# Patient Record
Sex: Male | Born: 1995 | Race: White | Hispanic: No | Marital: Single | Smoking: Current every day smoker
Health system: Southern US, Community
[De-identification: ages and names within clinical notes are randomized; demographics above are authoritative.]

## PROBLEM LIST (undated history)

## (undated) DIAGNOSIS — F419 Anxiety disorder, unspecified: Secondary | ICD-10-CM

## (undated) HISTORY — PX: BACK SURGERY: SHX140

---

## 2017-07-27 ENCOUNTER — Inpatient Hospital Stay (HOSPITAL_COMMUNITY)
Admission: RE | Admit: 2017-07-27 | Discharge: 2017-07-30 | DRG: 885 | Disposition: A | Discharge status: Home / Self Care | Payer: PRIVATE HEALTH INSURANCE | Attending: Psychiatry | Admitting: Psychiatry

## 2017-07-27 ENCOUNTER — Encounter (HOSPITAL_COMMUNITY): Payer: Self-pay | Admitting: *Deleted

## 2017-07-27 DIAGNOSIS — F3181 Bipolar II disorder: Secondary | ICD-10-CM | POA: Diagnosis present

## 2017-07-27 DIAGNOSIS — F1210 Cannabis abuse, uncomplicated: Secondary | ICD-10-CM | POA: Diagnosis not present

## 2017-07-27 DIAGNOSIS — F17210 Nicotine dependence, cigarettes, uncomplicated: Secondary | ICD-10-CM | POA: Diagnosis present

## 2017-07-27 DIAGNOSIS — F1910 Other psychoactive substance abuse, uncomplicated: Secondary | ICD-10-CM | POA: Diagnosis not present

## 2017-07-27 DIAGNOSIS — F419 Anxiety disorder, unspecified: Secondary | ICD-10-CM | POA: Diagnosis present

## 2017-07-27 DIAGNOSIS — F319 Bipolar disorder, unspecified: Secondary | ICD-10-CM | POA: Diagnosis not present

## 2017-07-27 DIAGNOSIS — Z23 Encounter for immunization: Secondary | ICD-10-CM | POA: Diagnosis not present

## 2017-07-27 DIAGNOSIS — R45851 Suicidal ideations: Secondary | ICD-10-CM | POA: Diagnosis not present

## 2017-07-27 DIAGNOSIS — G4700 Insomnia, unspecified: Secondary | ICD-10-CM | POA: Diagnosis present

## 2017-07-27 DIAGNOSIS — M545 Low back pain: Secondary | ICD-10-CM | POA: Diagnosis present

## 2017-07-27 DIAGNOSIS — Z818 Family history of other mental and behavioral disorders: Secondary | ICD-10-CM

## 2017-07-27 DIAGNOSIS — R443 Hallucinations, unspecified: Secondary | ICD-10-CM | POA: Diagnosis not present

## 2017-07-27 DIAGNOSIS — Z79899 Other long term (current) drug therapy: Secondary | ICD-10-CM | POA: Diagnosis not present

## 2017-07-27 DIAGNOSIS — F909 Attention-deficit hyperactivity disorder, unspecified type: Secondary | ICD-10-CM | POA: Diagnosis present

## 2017-07-27 DIAGNOSIS — F1410 Cocaine abuse, uncomplicated: Secondary | ICD-10-CM | POA: Diagnosis not present

## 2017-07-27 HISTORY — DX: Anxiety disorder, unspecified: F41.9

## 2017-07-27 MED ORDER — MAGNESIUM HYDROXIDE 400 MG/5ML PO SUSP
30.0000 mL | Freq: Every day | ORAL | Status: DC | PRN
  Administered 2017-07-28 – 2017-07-29 (×2): 30 mL via ORAL
  Filled 2017-07-27 (×2): qty 30

## 2017-07-27 MED ORDER — TRAZODONE HCL 50 MG PO TABS
50.0000 mg | ORAL_TABLET | Freq: Every evening | ORAL | Status: DC | PRN
  Administered 2017-07-27 – 2017-07-28 (×2): 50 mg via ORAL
  Filled 2017-07-27 (×2): qty 1

## 2017-07-27 MED ORDER — ALUM & MAG HYDROXIDE-SIMETH 200-200-20 MG/5ML PO SUSP: 30.0000 mL | ORAL | Status: DC | PRN

## 2017-07-27 MED ORDER — HYDROXYZINE HCL 25 MG PO TABS
25.0000 mg | ORAL_TABLET | Freq: Two times a day (BID) | ORAL | Status: DC | PRN
  Administered 2017-07-27: 25 mg via ORAL
  Filled 2017-07-27: qty 1

## 2017-07-27 MED ORDER — INFLUENZA VAC SPLIT QUAD 0.5 ML IM SUSY
0.5000 mL | PREFILLED_SYRINGE | INTRAMUSCULAR | Status: AC
  Administered 2017-07-28: 0.5 mL via INTRAMUSCULAR
  Filled 2017-07-27: qty 0.5

## 2017-07-27 MED ORDER — ACETAMINOPHEN 325 MG PO TABS
650.0000 mg | ORAL_TABLET | Freq: Four times a day (QID) | ORAL | Status: DC | PRN
Start: 2017-07-27 — End: 2017-07-30
  Administered 2017-07-28: 650 mg via ORAL
  Filled 2017-07-27: qty 2

## 2017-07-27 NOTE — Progress Notes (Signed)
Pt admitted voluntary after going to see his counselor at [facility]. Pt reported si with multiple plans. He has a hx of ADHD and bipolar disorder. Pt reports hearing voices in his head arguing. He is from [place] and is a Holiday representativejunior at [employer]. Pt lives in a townhouse with several roommates. He uses THC daily and occasional cocaine. Pt had back surgery one year ago. He denies hi.

## 2017-07-27 NOTE — Progress Notes (Signed)
Patient ID: Jeffery GottronRiley Greene, male   DOB: [birth year], 21 y.o.   MRN: [id number]  D: Patient observed watching TV and interacting well with peers on approach. Pt reports school as a major stressor. Pt reports there is so much pressure on him to do well. Pt reports hearing voices of two people arguing and the arguing gets louder to a point he is not able to concentrate. Pt attended evening wrap up group and Interacted appropriately with peers. Pt endorses passive suicide ideation. Pt verbally contracted for safety.  Denies pain or discomfort.No behavioral issues noted.  A: Support and encouragement offered as needed to express needs. Medications administered as prescribed.  R: Patient is safe and cooperative on unit. Will continue to monitor  for safety and stability.

## 2017-07-27 NOTE — H&P (Signed)
Behavioral Health Medical Screening Exam  Jeffery GottronRiley Greene is an 21 y.o. male.  Total Time spent with patient: 30 minutes  Psychiatric Specialty Exam: Physical Exam  ROS  Blood pressure 118/68, pulse 87, temperature 98.6 F (37 C), temperature source Oral, resp. rate 16, height 5\' 10"  (1.778 m), weight 84.4 kg (186 lb), SpO2 100 %.Body mass index is 26.69 kg/m.  General Appearance: Well Groomed  Eye Contact:  Good  Speech:  Clear and Coherent and Normal Rate  Volume:  Normal  Mood:  Anxious and Depressed  Affect:  Congruent  Thought Process:  Coherent and Goal Directed  Orientation:  Full (Time, Place, and Person)  Thought Content:  WDL and Logical  Suicidal Thoughts:  Yes.  with intent/plan  Homicidal Thoughts:  No  Memory:  Immediate;   Good Recent;   Good Remote;   Good  Judgement:  Good  Insight:  Good  Psychomotor Activity:  Normal  Concentration: Concentration: Good and Attention Span: Good  Recall:  Good  Fund of Knowledge:Good  Language: Good  Akathisia:  Negative  Handed:  Right  AIMS (if indicated):     Assets:  Communication Skills Desire for Improvement Financial Resources/Insurance Housing Intimacy Leisure Time Physical Health Social Support Vocational/Educational  Sleep:       Musculoskeletal: Strength & Muscle Tone: within normal limits Gait & Station: normal Patient leans: N/A  Blood pressure 118/68, pulse 87, temperature 98.6 F (37 C), temperature source Oral, resp. rate 16, height 5\' 10"  (1.778 m), weight 84.4 kg (186 lb), SpO2 100 %.  Recommendations:  Based on my evaluation the patient appears to have an emergency condition for which I recommend the patient be admitted inpatient for medication management and stabilization.   Jeffery PereyraJustina A Zyonna Vardaman, NP 07/27/2017, 7:08 PM

## 2017-07-27 NOTE — BH Assessment (Signed)
Assessment Note  Jeffery Greene is an 21 y.o. male presenting to [facility] after an appointment with his therapist, Rochel Brome, at the [facility]. The patient reported SI with a plan to OD or die by carbon monoxide. The patient had been considering plans for the last few weeks. Reports strong intent to act on plan. The patient reports depression since the age of 21 yr old, used hit self in the head and remembers thinking, "I want to be dead."  The patient was treated for depression and ADHD through high school. [facility] and treated by a psychiatrist at the clinic. The patient reports auditory hallucinations, hearing opposing thoughts and derogatory statements about himself. States he also feels paranoid, referenced the a [employer], saying it feels like everyone is watching him.   The patient denies HI. Reports cannabis use daily and alcohol use on the weekends.  The patient states he feels he has been outcasted by "all" of [employer] but did not elaborate. The patient attends [employer], and Is in his junior year. The patient is majoring in business and chemistry. Reports stressors involved school, he's failing chemistry. Also states he has issues with his girlfriend Leotis Shames and family of origin issues.  The patient has missed class, stays in bed, has depressed mood, lacks judgment and insight.  Leighton Ruff, NP recommends inpatient   Diagnosis: Bipolar, depressed, with psychotic features; cannabis use disorder  Past Medical History: No past medical history on file.  No past surgical history on file.  Family History: No family history on file.  Social History:  has no tobacco, alcohol, and drug history on file.  Additional Social History:  Alcohol / Drug Use Pain Medications: see MAR Prescriptions: see MAR Over the Counter: see MAR History of alcohol / drug use?: Yes Substance #1 Name of Substance 1: alcohol 1 -  Age of First Use: UTA 1 - Amount (size/oz): UTA 1 - Frequency: weekly 1 - Duration: UTA 1 - Last Use / Amount: using on the weekends with friends Substance #2 Name of Substance 2: cannabis- smoked in a pipe 2 - Age of First Use: UTA 2 - Amount (size/oz): 1 gram to 1.5 gram over a week or more 2 - Frequency: daily, through out the day 2 - Duration: years 2 - Last Use / Amount: today  CIWA: CIWA-Ar BP: 118/68 Pulse Rate: 87 COWS:    Allergies: Allergies not on file  Home Medications:  (Not in a hospital admission)  OB/GYN Status:  No LMP for male patient.  General Assessment Data Location of Assessment: [facility] Assessment Services TTS Assessment: In system Is this a Tele or Face-to-Face Assessment?: Face-to-Face Is this an Initial Assessment or a Re-assessment for this encounter?: Initial Assessment Marital status: Single Maiden name: n/a Is patient pregnant?: No Pregnancy Status: No Living Arrangements: Other (Comment) (dorm ) Can pt return to current living arrangement?: No Admission Status: Voluntary Is patient capable of signing voluntary admission?: Yes Referral Source: Self/Family/Friend Insurance type: self pay  Medical Screening Exam [facility] Walk-in ONLY) Medical Exam completed: Yes  Crisis Care Plan Living Arrangements: Other (Comment) (dorm ) [facility]  Education Status Is patient currently in school?: Yes Current Grade: Junior Highest grade of school patient has completed: sophmore in college Name of school: [employer]  Risk to self with the past 6 months Suicidal Ideation: Yes-Currently Present Has  patient been a risk to self within the past 6 months prior to admission? : Yes Suicidal Intent: Yes-Currently Present Has patient had any suicidal intent within the past 6 months prior to admission? : No Is patient at risk for suicide?: Yes Suicidal Plan?: Yes-Currently  Present Has patient had any suicidal plan within the past 6 months prior to admission? : No Specify Current Suicidal Plan: to OD or carbon monoxide Access to Means: Yes Specify Access to Suicidal Means: planning, considered options What has been your use of drugs/alcohol within the last 12 months?: alcohol and cannabis Previous Attempts/Gestures: Yes How many times?: 3 Other Self Harm Risks: hx of hitting head, pulling hair Triggers for Past Attempts: Unknown Intentional Self Injurious Behavior: Damaging Family Suicide History: No Recent stressful life event(s): Conflict (Comment), Trauma (Comment), Other (Comment) Persecutory voices/beliefs?: No Depression: Yes Depression Symptoms: Despondent, Tearfulness, Loss of interest in usual pleasures, Feeling worthless/self pity Substance abuse history and/or treatment for substance abuse?: Yes Suicide prevention information given to non-admitted patients: Not applicable  Risk to Others within the past 6 months Homicidal Ideation: No Does patient have any lifetime risk of violence toward others beyond the six months prior to admission? : No Thoughts of Harm to Others: No Current Homicidal Intent: No Current Homicidal Plan: No Access to Homicidal Means: No Identified Victim: n/a History of harm to others?: No Assessment of Violence: None Noted Does patient have access to weapons?: No Criminal Charges Pending?: No Does patient have a court date: No Is patient on probation?: No  Psychosis Hallucinations: Auditory Delusions: Unspecified  Mental Status Report Appearance/Hygiene: Unremarkable Eye Contact: Fair Motor Activity: Freedom of movement Speech: Logical/coherent Level of Consciousness: Alert Mood: Depressed, Anxious Affect: Depressed Anxiety Level: Severe Thought Processes: Coherent, Relevant Judgement: Impaired Orientation: Person, Place, Time, Situation Obsessive Compulsive Thoughts/Behaviors: None  Cognitive  Functioning Concentration: Normal Memory: Recent Intact, Remote Intact IQ: Average Insight: Poor Impulse Control: Poor Appetite: Good Weight Loss: 0 Weight Gain: 0 Sleep: Decreased Vegetative Symptoms: Staying in bed  ADLScreening [facility] Patient's cognitive ability adequate to safely complete daily activities?: Yes Patient able to express need for assistance with ADLs?: Yes Independently performs ADLs?: Yes (appropriate for developmental age)  Prior Inpatient Therapy Prior Inpatient Therapy: No  Prior Outpatient Therapy Prior Outpatient Therapy: Yes Prior Therapy Dates: ongoing Prior Therapy [facility]ilty[facility]Provider([facility] Reason for Treatment: bipolar Does patient have an ACCT team?: No Does patient have Intensive In-House Services?  : No Does patient have [facility] services? : No Does patient have P4CC services?: No  ADL Screening (condition at time of admission) Patient's cognitive ability adequate to safely complete daily activities?: Yes Is the patient deaf or have difficulty hearing?: No Does the patient have difficulty seeing, even when wearing glasses/contacts?: No Does the patient have difficulty concentrating, remembering, or making decisions?: No Patient able to express need for assistance with ADLs?: Yes Does the patient have difficulty dressing or bathing?: No Independently performs ADLs?: Yes (appropriate for developmental age)       Abuse/Neglect Assessment (Assessment to be complete while patient is alone) Physical Abuse:  (UTA) Verbal Abuse:  (UTA) Sexual Abuse:  (UTA)     Advance Directives (For Healthcare) Does Patient Have a Medical Advance Directive?: No    Additional Information 1:1 In Past 12 Months?: No CIRT Risk: No Elopement Risk: No Does patient have medical clearance?: Yes     Disposition:  Disposition Initial Assessment Completed for this Encounter: Yes Disposition of Patient: Inpatient treatment  program Type of inpatient treatment program: Adult  On Site Evaluation by:   Reviewed with Physician:  Leighton Ruff, NP recommends inpatient   Vonzell Schlatter [facility] 07/27/2017 5:37 PM

## 2017-07-27 NOTE — Tx Team (Signed)
Initial Treatment Plan 07/27/2017 7:33 PM Jeffery Greene QIO:[id number]    PATIENT STRESSORS: Marital or family conflict   PATIENT STRENGTHS: Ability for insight Average or above average intelligence Communication skills General fund of knowledge Physical Health Supportive family/friends   PATIENT IDENTIFIED PROBLEMS: Family conflict  School stress                   DISCHARGE CRITERIA:  Ability to meet basic life and health needs Adequate post-discharge living arrangements Improved stabilization in mood, thinking, and/or behavior Motivation to continue treatment in a less acute level of care Need for constant or close observation no longer present Reduction of life-threatening or endangering symptoms to within safe limits Safe-care adequate arrangements made Verbal commitment to aftercare and medication compliance  PRELIMINARY DISCHARGE PLAN: Outpatient therapy Return to previous living arrangement Return to previous work or school arrangements  PATIENT/FAMILY INVOLVEMENT: This treatment plan has been presented to and reviewed with the patient, Jeffery GottronRiley Kraker, and/or family member, .  The patient and family have been given the opportunity to ask questions and make suggestions.  Beatrix ShipperWright, Reggie Bise Martin, RN 07/27/2017, 7:33 PM

## 2017-07-27 NOTE — Progress Notes (Signed)
Attended group but [facility].

## 2017-07-28 DIAGNOSIS — G4700 Insomnia, unspecified: Secondary | ICD-10-CM

## 2017-07-28 DIAGNOSIS — F319 Bipolar disorder, unspecified: Secondary | ICD-10-CM

## 2017-07-28 DIAGNOSIS — R45851 Suicidal ideations: Secondary | ICD-10-CM

## 2017-07-28 DIAGNOSIS — F1410 Cocaine abuse, uncomplicated: Secondary | ICD-10-CM

## 2017-07-28 DIAGNOSIS — R443 Hallucinations, unspecified: Secondary | ICD-10-CM

## 2017-07-28 DIAGNOSIS — F17210 Nicotine dependence, cigarettes, uncomplicated: Secondary | ICD-10-CM

## 2017-07-28 DIAGNOSIS — F1910 Other psychoactive substance abuse, uncomplicated: Secondary | ICD-10-CM

## 2017-07-28 DIAGNOSIS — F419 Anxiety disorder, unspecified: Secondary | ICD-10-CM

## 2017-07-28 DIAGNOSIS — F1210 Cannabis abuse, uncomplicated: Secondary | ICD-10-CM

## 2017-07-28 LAB — ETHANOL: Alcohol, Ethyl (B): <10 mg/dL (ref <10)

## 2017-07-28 MED ORDER — ARIPIPRAZOLE 5 MG PO TABS: 5.0000 mg | ORAL_TABLET | Freq: Every day | ORAL | Status: DC

## 2017-07-28 MED ORDER — ARIPIPRAZOLE 5 MG PO TABS
5.0000 mg | ORAL_TABLET | Freq: Every day | ORAL | Status: DC
  Administered 2017-07-28 – 2017-07-30 (×3): 5 mg via ORAL
  Filled 2017-07-28 (×5): qty 1

## 2017-07-28 MED ORDER — BUPROPION HCL ER (XL) 150 MG PO TB24
150.0000 mg | ORAL_TABLET | Freq: Every day | ORAL | Status: DC
Start: 2017-07-28 — End: 2017-07-30
  Administered 2017-07-28 – 2017-07-30 (×3): 150 mg via ORAL
  Filled 2017-07-28 (×6): qty 1

## 2017-07-28 MED ORDER — DOCUSATE SODIUM 100 MG PO CAPS
100.0000 mg | ORAL_CAPSULE | Freq: Every day | ORAL | Status: DC
  Administered 2017-07-28 – 2017-07-30 (×3): 100 mg via ORAL
  Filled 2017-07-28 (×6): qty 1

## 2017-07-28 MED ORDER — GABAPENTIN 100 MG PO CAPS
100.0000 mg | ORAL_CAPSULE | Freq: Three times a day (TID) | ORAL | Status: DC
  Administered 2017-07-28 – 2017-07-30 (×6): 100 mg via ORAL
  Filled 2017-07-28 (×11): qty 1

## 2017-07-28 MED ORDER — HYDROXYZINE HCL 25 MG PO TABS
25.0000 mg | ORAL_TABLET | Freq: Four times a day (QID) | ORAL | Status: DC | PRN
  Administered 2017-07-28: 25 mg via ORAL
  Filled 2017-07-28: qty 1

## 2017-07-28 NOTE — Progress Notes (Signed)
Nutrition Brief Note  Patient identified on the Malnutrition Screening Tool (MST) Report  Wt Readings from Last 15 Encounters:  07/27/17 186 lb (84.4 kg)    Body mass index is 26.69 kg/m. Patient meets criteria for overweight based on current BMI.  Labs and medications reviewed.   No nutrition interventions warranted at this time. If nutrition issues arise, please consult RD.   Tilda FrancoLindsey Faylene Allerton, MS, RD, LDN [facility] Clinical Dietitian Pager: [phone] After Hours Pager: [phone]

## 2017-07-28 NOTE — Plan of Care (Signed)
Problem: Safety: Goal: Periods of time without injury will increase Outcome: Progressing Q 15 minutes safety checks maintained on and off unit without self harm gestures.   Problem: Medication: Goal: Compliance with prescribed medication regimen will improve Outcome: Progressing Pt has been medication compliant this shift. Pt in agreement with medication changes made this shift as well.

## 2017-07-28 NOTE — Tx Team (Signed)
Interdisciplinary Treatment and Diagnostic Plan Update   Time of Session: 830AM Jeffery GottronRiley Greene MRN: [id number]  Principal Diagnosis: MDD, recurrent with psychotic features Secondary Diagnoses: Active Problems:   Bipolar disorder with psychotic features (HCC)   Current Medications:  Current Facility-Administered Medications  Medication Dose Route Frequency Provider Last Rate Last Dose  . acetaminophen (TYLENOL) tablet 650 mg  650 mg Oral Q6H PRN Okonkwo, Justina A, NP   650 mg at 07/28/17 0846  . alum & mag hydroxide-simeth (MAALOX/MYLANTA) 200-200-20 MG/5ML suspension 30 mL  30 mL Oral Q4H PRN Okonkwo, Justina A, NP      . hydrOXYzine (ATARAX/VISTARIL) tablet 25 mg  25 mg Oral BID PRN Beryle Lathekonkwo, Justina A, NP   25 mg at 07/27/17 2125  . Influenza vac split quadrivalent PF (FLUARIX) injection 0.5 mL  0.5 mL Intramuscular Tomorrow-1000 Nwoko, Agnes I, NP      . magnesium hydroxide (MILK OF MAGNESIA) suspension 30 mL  30 mL Oral Daily PRN Okonkwo, Justina A, NP   30 mL at 07/28/17 0845  . traZODone (DESYREL) tablet 50 mg  50 mg Oral QHS PRN Beryle Lathekonkwo, Justina A, NP   50 mg at 07/27/17 2125   PTA Medications: No prescriptions prior to admission.    Patient Stressors: Marital or family conflict  Patient Strengths: Ability for insight Average or above average intelligence Communication skills General fund of knowledge Physical Health Supportive family/friends  Treatment Modalities: Medication Management, Group therapy, Case management,  1 to 1 session with clinician, Psychoeducation, Recreational therapy.   Physician Treatment Plan for Primary Diagnosis:MDD, recurrent with psychotic features Long Term Goal(s): Improvement in symptoms so as ready for discharge Improvement in symptoms so as ready for discharge   Short Term Goals: Ability to identify changes in lifestyle to reduce recurrence of condition will improve Ability to verbalize feelings will improve Ability to disclose and  discuss suicidal ideas Ability to identify and develop effective coping behaviors will improve Compliance with prescribed medications will improve Ability to identify triggers associated with substance abuse/mental health issues will improve  Medication Management: Evaluate patient's response, side effects, and tolerance of medication regimen.  Therapeutic Interventions: 1 to 1 sessions, Unit Group sessions and Medication administration.  Evaluation of Outcomes: Progressing Physician Treatment Plan for Secondary Diagnosis: Active Problems:   Bipolar disorder with psychotic features (HCC)  Long Term Goal(s): Improvement in symptoms so as ready for discharge Improvement in symptoms so as ready for discharge   Short Term Goals: Ability to identify changes in lifestyle to reduce recurrence of condition will improve Ability to verbalize feelings will improve Ability to disclose and discuss suicidal ideas Ability to identify and develop effective coping behaviors will improve Compliance with prescribed medications will improve Ability to identify triggers associated with substance abuse/mental health issues will improve     Medication Management: Evaluate patient's response, side effects, and tolerance of medication regimen.  Therapeutic Interventions: 1 to 1 sessions, Unit Group sessions and Medication administration.  Evaluation of Outcomes: Progressing   RN Treatment Plan for Primary Diagnosis: MDD, recurrent with psychotic features Long Term Goal(s): Knowledge of disease and therapeutic regimen to maintain health will improve  Short Term Goals: Ability to remain free from injury will improve, Ability to disclose and discuss suicidal ideas and Compliance with prescribed medications will improve  Medication Management: RN will administer medications as ordered by provider, will assess and evaluate patient's response and provide education to patient for prescribed medication. RN will  report any adverse and/or side effects to  prescribing provider.  Therapeutic Interventions: 1 on 1 counseling sessions, Psychoeducation, Medication administration, Evaluate responses to treatment, Monitor vital signs and CBGs as ordered, Perform/monitor CIWA, COWS, AIMS and Fall Risk screenings as ordered, Perform wound care treatments as ordered.  Evaluation of Outcomes: Progressing   LCSW Treatment Plan for Primary Diagnosis: MDD, recurrent with psychotic features Long Term Goal(s): Safe transition to appropriate next level of care at discharge, Engage patient in therapeutic group addressing interpersonal concerns.  Short Term Goals: Engage patient in aftercare planning with referrals and resources, Increase emotional regulation and Increase skills for wellness and recovery  Therapeutic Interventions: Assess for all discharge needs, 1 to 1 time with Social worker, Explore available resources and support systems, Assess for adequacy in community support network, Educate family and significant other(s) on suicide prevention, Complete Psychosocial Assessment, Interpersonal group therapy.  Evaluation of Outcomes: Progressing   Progress in Treatment: Attending groups: Yes. Participating in groups: Yes. Taking medication as prescribed: Yes. Toleration medication: Yes. Family/Significant other contact made: No, will contact:  pt consented to contacting parents. Patient understands diagnosis: Yes. Discussing patient identified problems/goals with staff: Yes. Medical problems stabilized or resolved: Yes. Denies suicidal/homicidal ideation: Yes. Issues/concerns per patient self-inventory: No. Other: n/a  New problem(s) identified: No, Describe:  n/a  New Short Term/Long Term Goal(s): medication management for mood stabilization and development of comprehensive mental wellness plan.  Discharge Plan or Barriers: return to [facility] for [facility], home, and  school.  Reason for Continuation of Hospitalization: Anxiety Depression Medication stabilization  Estimated Length of Stay: Saturday 07/31/17  Attendees: Patient: 07/28/2017 11:34 AM  Physician: Dr. Altamese [place], MD 07/28/2017 11:34 AM  Nursing: Vivia Budge, RN 07/28/2017 11:34 AM  RN Care Manager: Onnie Boer, CM 07/28/2017 11:34 AM  Social Worker: Trula Slade, LCSW 07/28/2017 11:34 AM  Recreational Therapist: x 07/28/2017 11:34 AM  Other: Armandina Stammer, NP, Reola Calkins, NP 07/28/2017 11:34 AM  Other:  07/28/2017 11:34 AM  Other: 07/28/2017 11:34 AM    Scribe for Treatment Team: Ledell Peoples Smart, LCSW 07/28/2017 11:34 AM

## 2017-07-28 NOTE — Progress Notes (Signed)
Recreation Therapy Notes  Date: 07/28/17 Time: 1000 Location: 500 Hall Dayroom  Group Topic: Stress Management  Goal Area(s) Addresses:  Patient will verbalize importance of using healthy stress management.  Patient will identify positive emotions associated with healthy stress management.   Intervention: Construction paper, markers  Activity :  Personalized Plates.  Patients were to create a personalized plate where they were to highlight the positive things about themselves.  Patients could highlight things such as biggest accomplishment, important dates, favorite foods, etc.  Education:  Stress Management, Discharge Planning.   Education Outcome: Acknowledges edcuation/In group clarification offered/Needs additional education  Clinical Observations/Feedback:  Pt did not attend group.   Kariyah Baugh, LRT/CTRS         Shanyn Preisler A 07/28/2017 12:32 PM 

## 2017-07-28 NOTE — H&P (Signed)
Psychiatric Admission Assessment Adult  Patient Identification: Jeffery Greene  MRN:  [id number]  Date of Evaluation:  07/28/2017  Chief Complaint:  Worsening Bipolar disorder with psychotic features, triggering suicidal ideations with plans.  Principal Diagnosis: Bipolar disorder with psychotic features.  Diagnosis:   Patient Active Problem List   Diagnosis Date Noted  . Bipolar disorder with psychotic features (HCC) [F31.9] 07/27/2017   History of Present Illness: This is an admission assessment for this 21 year old Caucasian male. He is currently a Consulting civil engineer at the [employer]. He came to the [facility] as a walk-in seeking treatment for worsening Bipolar disorder symptoms. Chart review indicated that Jeffery Greene presented with complaints of suicidal thoughts with plans to overdose on medication or suffocation using Carbon Monoxide. There was a mention of what presents as ideas of reference, referencing the [employer].  During this assessment, Jeffery Greene presents, casually dressed, alert, oriented x 4 & uptight. He reports, "I came here because I was having suicidal thoughts. This has been going for a while 6-7 years. I have never had a plan or attempted suicide. I was born this way. I was diagnosed with Bipolar disorder 2 months ago. I was put on Depakote, Wellbutrin & Vyvanse. I was also diagnosed with ADHD at age 51. I went to the mood treatment center in [facility] for help because I felt like my depression & suicidal thoughts worsened. That was when they diagnosed me with having Bipolar disorder. I think that the Depakote is helping some, however, I have other issues going on & yesterday was just a bad day that made everything else crashed. I was fighting a lot with my parents, my girlfriend & I were arguing a lot. I feel insolated from everyone. I have this back pain that is constant, it is adding to my depression. I cannot get anyone to prescribe some pain medications. Now, I cannot stay in this  hospital. I need to be discharged. I'm not like these patients, they are crazy, I'm not. So, can you discharge me?".  Associated Signs/Symptoms:  Depression Symptoms:  Jeffery Greene is currently denying any symptoms of depression. He rates his depression #0.  (Hypo) Manic Symptoms:  Labiality of Mood,  Anxiety Symptoms:  Excessive Worry,  Psychotic Symptoms:  Currently denies any hallucinations, delusional thoughts or paranoia, however, chart review reports indicated that patient was paranoid, hearing voices & having ideas of reference.  PTSD Symptoms: Denies any pTSD symptoms or event.  Total Time spent with patient: 1 hour  Past Psychiatric History: Bipolar disorder, ADHD.  Is the patient at risk to self? No.  Has the patient been a risk to self in the past 6 months? Yes.    Has the patient been a risk to self within the distant past? Yes.    Is the patient a risk to others? No.  Has the patient been a risk to others in the past 6 months? No.  Has the patient been a risk to others within the distant past? No.   Prior Inpatient Therapy: Prior Inpatient Therapy: No Prior Outpatient Therapy: Prior Outpatient Therapy: Yes Prior Therapy Dates: ongoing Prior Therapy Facilty/Provider(s): [facility] Reason for Treatment: bipolar Does patient have an ACCT team?: No Does patient have Intensive In-House Services?  : No Does patient have [facility] services? : No Does patient have P4CC services?: No  Alcohol Screening: 1. How often do you have a drink containing alcohol?: 2 to 3 times a week 2. How many drinks containing alcohol do you  have on a typical day when you are drinking?: 5 or 6 3. How often do you have six or more drinks on one occasion?: Weekly Preliminary Score: 5 4. How often during the last year have you found that you were not able to stop drinking once you had started?: Never 5. How often during the last year have you failed to do what was normally expected from you becasue of  drinking?: Never 6. How often during the last year have you needed a first drink in the morning to get yourself going after a heavy drinking session?: Never 7. How often during the last year have you had a feeling of guilt of remorse after drinking?: Never 8. How often during the last year have you been unable to remember what happened the night before because you had been drinking?: Monthly 9. Have you or someone else been injured as a result of your drinking?: No 10. Has a relative or friend or a doctor or another health worker been concerned about your drinking or suggested you cut down?: No Alcohol Use Disorder Identification Test Final Score (AUDIT): 10 Brief Intervention: Yes  Substance Abuse History in the last 12 months:  Yes.  (Smokes weed).  Consequences of Substance Abuse: Medical Consequences:  Liver damage, Possible death by overdose Legal Consequences:  Arrests, jail time, Loss of driving privilege. Family Consequences:  Family discord, divorce and or separation.  Previous Psychotropic Medications: Yes (Wellbutrin).  Psychological Evaluations: No   Past Medical History:  Past Medical History:  Diagnosis Date  . Anxiety     Past Surgical History:  Procedure Laterality Date  . BACK SURGERY     Family History: History reviewed. No pertinent family history.  Family Psychiatric  History: Major depressive disorder: Father.  Tobacco Screening: Have you used any form of tobacco in the last 30 days? (Cigarettes, Smokeless Tobacco, Cigars, and/or Pipes): Yes Tobacco use, Select all that apply: 5 or more cigarettes per day Are you interested in Tobacco Cessation Medications?: No, patient refused Counseled patient on smoking cessation including recognizing danger situations, developing coping skills and basic information about quitting provided: Refused/Declined practical counseling  Social History:  History  Alcohol use Not on file     History  Drug Use  . Types:  Cocaine, Marijuana    Comment: marijuana daily/cocaine occas    Additional Social History: Marital status: Single    Pain Medications: see MAR Prescriptions: see MAR Over the Counter: see MAR History of alcohol / drug use?: Yes Name of Substance 1: alcohol 1 - Age of First Use: UTA 1 - Amount (size/oz): UTA 1 - Frequency: weekly 1 - Duration: UTA 1 - Last Use / Amount: using on the weekends with friends Name of Substance 2: cannabis- smoked in a pipe 2 - Age of First Use: UTA 2 - Amount (size/oz): 1 gram to 1.5 gram over a week or more 2 - Frequency: daily, through out the day 2 - Duration: years 2 - Last Use / Amount: today  Allergies:   Allergies  Allergen Reactions  . Other     Tree Nuts   Lab Results: No results found for this or any previous visit (from the past 48 hour(s)).  Blood Alcohol level:  No results found for: [facility]  Metabolic Disorder Labs:  No results found for: HGBA1C, MPG No results found for: PROLACTIN No results found for: CHOL, TRIG, HDL, CHOLHDL, VLDL, LDLCALC  Current Medications: Current Facility-Administered Medications  Medication Dose Route Frequency Provider  Last Rate Last Dose  . acetaminophen (TYLENOL) tablet 650 mg  650 mg Oral Q6H PRN Okonkwo, Justina A, NP   650 mg at 07/28/17 0846  . alum & mag hydroxide-simeth (MAALOX/MYLANTA) 200-200-20 MG/5ML suspension 30 mL  30 mL Oral Q4H PRN Okonkwo, Justina A, NP      . hydrOXYzine (ATARAX/VISTARIL) tablet 25 mg  25 mg Oral BID PRN Lu Duffel, Justina A, NP   25 mg at 07/27/17 2125  . Influenza vac split quadrivalent PF (FLUARIX) injection 0.5 mL  0.5 mL Intramuscular Tomorrow-1000 Nwoko, Agnes I, NP      . magnesium hydroxide (MILK OF MAGNESIA) suspension 30 mL  30 mL Oral Daily PRN Okonkwo, Justina A, NP   30 mL at 07/28/17 0845  . traZODone (DESYREL) tablet 50 mg  50 mg Oral QHS PRN Lu Duffel, Justina A, NP   50 mg at 07/27/17 2125   PTA Medications: No prescriptions prior to admission.    Musculoskeletal: Strength & Muscle Tone: within normal limits Gait & Station: normal Patient leans: N/A  Psychiatric Specialty Exam: Physical Exam  Constitutional: He appears well-developed.  HENT:  Head: Normocephalic.  Eyes: Pupils are equal, round, and reactive to light.  Neck: Normal range of motion.  Cardiovascular: Normal rate.   Respiratory: Effort normal.  GI: Soft.  Genitourinary:  Genitourinary Comments: Deferred  Musculoskeletal: Normal range of motion.  Neurological: He is alert.  Skin: Skin is warm.    Review of Systems  Constitutional: Negative.   HENT: Negative.   Eyes: Negative.   Respiratory: Negative.   Cardiovascular: Negative.   Gastrointestinal: Negative.   Genitourinary: Negative.   Musculoskeletal: Negative.   Skin: Negative.   Neurological: Negative.   Endo/Heme/Allergies: Negative.   Psychiatric/Behavioral: Positive for depression, hallucinations (Auditory), substance abuse ( THC use) and suicidal ideas. Negative for memory loss. The patient is nervous/anxious and has insomnia.     Blood pressure (!) 100/55, pulse 96, temperature 97.7 F (36.5 C), temperature source Oral, resp. rate 20, height $RemoveBe'5\' 10"'PehUPROFq$  (1.778 m), weight 84.4 kg (186 lb), SpO2 100 %.Body mass index is 26.69 kg/m.  General Appearance: Casual, anxious, uptight about being in this hospital  Eye Contact:  Fair  Speech:  Clear and Coherent and Normal Rate  Volume:  Normal  Mood:  Anxious  Affect:  Flat  Thought Process:  Coherent and Linear  Orientation:  Full (Time, Place, and Person)  Thought Content:  Denies any hallucinations, delusional thoughts or paranoia.  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 Admit for crisis management and stabilization, estimated length of stay 3-5 days.   2. Medication management to reduce current symptoms to base line and improve the patient's overall level of functioning: See MAR, Md's SRA & treatment plan.   3. Treat health problems as indicated.  4. Develop treatment plan to decrease risk of relapse upon discharge and the need for readmission.  5. Psycho-social education regarding relapse prevention and self care.  6. Health care follow up as needed for medical problems.  7. Review, reconcile, and reinstate any pertinent home medications for other health issues where appropriate. 8. Call for consults with hospitalist for any additional specialty patient care services as needed.  Observation Level/Precautions:  15 minute checks  Laboratory:  Will obtain: CBC with diff, USD, toxicology tests, TSH, Prolactin levels, Lipid panel, U/A, HGBA1c  Psychotherapy: Group sessions   Medications: See MAR   Consultations: As needed   Discharge Concerns: Safety, mood stability.  Estimated LOS: 5-7 days  Other: Admit to the 500-hall.     Physician Treatment Plan for Primary Diagnosis: Will initiate medication management for mood stability. Set up an outpatient psychiatric services for medication management. Will encourage medication adherence with psychiatric medications.  Long Term Goal(s): Improvement in symptoms so as ready for discharge  Short Term Goals: Ability to identify changes in lifestyle to reduce recurrence of condition will improve, Ability to verbalize feelings will improve and Ability to disclose and discuss suicidal ideas  Physician Treatment Plan for Secondary Diagnosis: Active  Problems:   Bipolar disorder with psychotic features ([place])  Long Term Goal(s): Improvement in symptoms so as ready for discharge  Short Term Goals: Ability to identify and develop effective coping behaviors will improve, Compliance with prescribed medications will improve and Ability to identify triggers associated with substance abuse/mental health issues will improve  I certify that inpatient services furnished can reasonably be expected to improve the patient's condition.    Jeffery Slates, NP, PMHNP, FNP-BC 10/24/201810:56 AM   I have reviewed NP's Note, assessement, diagnosis and plan, and agree. I have also met with patient and completed suicide risk assessment.  Jeffery Greene is a 21 y/o M with history of bipolar disorder who presented voluntarily via his therapy appointment when he expressed worsening depression and SI with plan to use carbon monoxide. Pt also had expressed intrusive thoughts that he characterized as voices which told him negative things. He also experienced paranoid thoughts that he was being monitored or watched whenever he came into a room. He lists several stressors including strained relationship with his parents, strained relationship with his girlfriend, having to drop a chemistry course and having worsened grades. He describes struggling with depression for several years, and having periodic elevations of mood with decreased need for sleep and increased activities lasting up to 3 nights with no sleep. Upon interview, pt is anxious and concerned about having to stay at [facility]. He explains that report of AH is not accurate, and he instead describes having his own negative thoughts which have an internal locus. He denies VH. He denies current SI, and he explains that yesterday he was feeling overwhelmed with his multiple stressors and had looked up methods of painless suicide as a stress relief, but he denies having any intention of attempting suicide. He denies HI. He  reports that recently his sleep has been good with the use of Lunesta '2mg'$  qhs at home, but he does not sleep well without it. He has been taking his medications as directed which include wellbutrin '150mg'$  BID and depakote. He would like to change from depakote because he feels it has increased his appetite significantly. Pt reports using cannabis about twice per week, but he has cut back as he was mainly using it for help with physical pain from a previous back surgery.  Discussed with patient about trial of abilify instead of depakote which would  reduce risk for weight gain, and pt was in agreement. We also agreed to decrease dose of wellbutrin as it may be contributing to anxiety and activation of hypomanic symptoms. He had no further questions, comments, or concerns. PLAN OF CARE:  - admit to the inpatient psychiatry unit - start abilify '5mg'$  qDay - discontinue depakote - Change wellbutrin '150mg'$  BID to wellbutrin '150mg'$  qDay - hold Lunesta and Vyvanse while pt is admitted to the hospital - Continue Vistaril '25mg'$  q6h PRN anxiety - Continue trazodone '50mg'$  qhs PRN insomnia - Labs: CBC, ethanol, Hemoglobin A1c, lipid panel, prolactin, TSH - encourage participation in groups and therapeutic milieu - Discharge planning will be ongoing  Maris Berger, MD

## 2017-07-28 NOTE — [facility] Suicide Risk Assessment (Signed)
[facility] INPATIENT:  Family/Significant Other Suicide Prevention Education  Suicide Prevention Education:  Education Completed; Leeanne DeedKim Vea (pt's mother) [phone] has been identified by the patient as the family member/significant other with whom the patient will be residing, and identified as the person(s) who will aid the patient in the event of a mental health crisis (suicidal ideations/suicide attempt).  With written consent from the patient, the family member/significant other has been provided the following suicide prevention education, prior to the and/or following the discharge of the patient.  The suicide prevention education provided includes the following:  Suicide risk factors  Suicide prevention and interventions  National Suicide Hotline telephone number  [facility] assessment telephone number  [facility] Assistance 911  [facility] and/or Residential Mobile Crisis Unit telephone number  Request made of family/significant other to:  Remove weapons (e.g., guns, rifles, knives), all items previously/currently identified as safety concern.    Remove drugs/medications (over-the-counter, prescriptions, illicit drugs), all items previously/currently identified as a safety concern.  The family member/significant other verbalizes understanding of the suicide prevention education information provided.  The family member/significant other agrees to remove the items of safety concern listed above.  SPE/aftercare reviewed with pt's mother. Pt's mother shared that she is concerned about pt's SI and thinks that he may become overwhelmed if he returns to school. She and her husband will speak with pt about possibly returning home and will discuss this with patient while he is in the hospital. She denies that pt has access to guns/firearms.   Vita Currin N Smart LCSW 07/28/2017, 3:21 PM

## 2017-07-28 NOTE — [facility] Suicide Risk Assessment (Signed)
[facility] Admission Suicide Risk Assessment   Nursing information obtained from:  Patient Demographic factors:  Male, Adolescent or young adult, Caucasian, Unemployed Current Mental Status:  Suicidal ideation indicated by patient Loss Factors:  NA Historical Factors:  Prior suicide attempts, Family history of mental illness or substance abuse Risk Reduction Factors:  Living with another person, especially a relative  Total Time spent with patient: 30 minutes Principal Problem: depressive episode with mixed features Diagnosis:   Patient Active Problem List   Diagnosis Date Noted  . Bipolar disorder with psychotic features (HCC) [F31.9] 07/27/2017   Subjective Data:  -Jeffery Greene is a 21 y/o M with history of bipolar disorder who presented voluntarily via his therapy appointment when he expressed worsening depression and SI with plan to use carbon monoxide. Pt also had expressed intrusive thoughts that he characterized as voices which told him negative things. He also experienced paranoid thoughts that he was being monitored or watched whenever he came into a room. He lists several stressors including strained relationship with his parents, strained relationship with his girlfriend, having to drop a chemistry course and having worsened grades. He describes struggling with depression for several years, and having periodic elevations of mood with decreased need for sleep and increased activities lasting up to 3 nights with no sleep. Upon interview, pt is anxious and concerned about having to stay at [facility]. He explains that report of AH is not accurate, and he instead describes having his own negative thoughts which have an internal locus. He denies VH. He denies current SI, and he explains that yesterday he was feeling overwhelmed with his multiple stressors and had looked up methods of painless suicide as a stress relief, but he denies having any intention of attempting suicide. He denies HI. He reports  that recently his sleep has been good with the use of Lunesta 2mg  qhs at home, but he does not sleep well without it. He has been taking his medications as directed which include wellbutrin 150mg  BID and depakote. He would like to change from depakote because he feels it has increased his appetite significantly. Pt reports using cannabis about twice per week, but he has cut back as he was mainly using it for help with physical pain from a previous back surgery.  Discussed with patient about trial of abilify instead of depakote which would reduce risk for weight gain, and pt was in agreement. We also agreed to decrease dose of wellbutrin as it may be contributing to anxiety and activation of hypomanic symptoms. He had no further questions, comments, or concerns.  Continued Clinical Symptoms:  Alcohol Use Disorder Identification Test Final Score (AUDIT): 10 The "Alcohol Use Disorders Identification Test", Guidelines for Use in Primary Care, Second Edition.  World Science writer [facility]). Score between 0-7:  no or low risk or alcohol related problems. Score between 8-15:  moderate risk of alcohol related problems. Score between 16-19:  high risk of alcohol related problems. Score 20 or above:  warrants further diagnostic evaluation for alcohol dependence and treatment.   CLINICAL FACTORS:   Severe Anxiety and/or Agitation Bipolar Disorder:   Mixed State   Musculoskeletal: Strength & Muscle Tone: within normal limits Gait & Station: normal Patient leans: N/A  Psychiatric Specialty Exam: Physical Exam  Nursing note and vitals reviewed.   Review of Systems  Constitutional: Negative for chills and fever.  Cardiovascular: Negative for chest pain and palpitations.  Gastrointestinal: Negative for heartburn and nausea.  Skin: Negative for rash.  Psychiatric/Behavioral:  Positive for depression and suicidal ideas.    Blood pressure 118/75, pulse 91, temperature 97.7 F (36.5 C), temperature  source Oral, resp. rate 20, height 5\' 10"  (1.778 m), weight 84.4 kg (186 lb), SpO2 100 %.Body mass index is 26.69 kg/m.  General Appearance: Casual and Fairly Groomed  Eye Contact:  Good  Speech:  Clear and Coherent and Normal Rate  Volume:  Normal  Mood:  Anxious and Depressed  Affect:  NA, Congruent and Constricted  Thought Process:  Coherent and Goal Directed  Orientation:  Full (Time, Place, and Person)  Thought Content:  Hallucinations: Auditory and Paranoid Ideation  Suicidal Thoughts:  Yes.  with intent/plan  Homicidal Thoughts:  No  Memory:  Immediate;   Good Recent;   Good Remote;   Good  Judgement:  Impaired  Insight:  Fair  Psychomotor Activity:  Normal  Concentration:  Concentration: Fair  Recall:  Good  Fund of Knowledge:  Good  Language:  Good  Akathisia:  No  Handed:    AIMS (if indicated):     Assets:  Communication Skills Desire for Improvement Financial Resources/Insurance Housing Intimacy Leisure Time Physical Health Resilience Social Support Vocational/Educational  ADL's:  Intact  Cognition:  WNL  Sleep:  Number of Hours: 6.5      COGNITIVE FEATURES THAT CONTRIBUTE TO RISK:  None    SUICIDE RISK:   Moderate:  Frequent suicidal ideation with limited intensity, and duration, some specificity in terms of plans, no associated intent, good self-control, limited dysphoria/symptomatology, some risk factors present, and identifiable protective factors, including available and accessible social support.  PLAN OF CARE:   - admit to the inpatient psychiatry unit - start abilify 5mg  qDay - discontinue depakote - Change wellbutrin 150mg  BID to wellbutrin 150mg  qDay - hold Lunesta and Vyvanse while pt is admitted to the hospital - Continue Vistaril 25mg  q6h PRN anxiety - Continue trazodone 50mg  qhs PRN insomnia - Labs: CBC, ethanol, Hemoglobin A1c, lipid panel, prolactin, TSH - encourage participation in groups and therapeutic milieu - Discharge  planning will be ongoing  I certify that inpatient services furnished can reasonably be expected to improve the patient's condition.   Micheal Likenshristopher T Saleah Rishel, MD 07/28/2017, 2:30 PM

## 2017-07-28 NOTE — Progress Notes (Signed)
Recreation Therapy Notes  INPATIENT RECREATION THERAPY ASSESSMENT  Patient Details Name: Jeffery Greene MRN: [id number] DOB: Oct 07, 1995 Today's Date: 07/28/2017  Patient Stressors: Family, Relationship, Friends, School  Pt stated he was here for suicidal thoughts.  Coping Skills:   Isolate, Avoidance, Talking, Music, Sports  Personal Challenges: Anger, Expressing Yourself, School Performance, Self-Esteem/Confidence, Stress Management  Leisure Interests (2+):  Sports - Basketball, Technical brewerature - ArchitectHunting, Social - Friends, Garment/textile technologistCommunity - Other (Comment) (Gym)  Awareness of Community Resources:  Yes  Community Resources:  Library, [employer], Other (Comment) Soil scientist(Community center)  Current Use: Yes  Patient StrengthsHotel manager:  Smart; funny  Patient Identified Areas of Improvement:  Confidence; communication  Current Recreation Participation:  5 times Greene week  Patient Goal for Hospitalization:  "to get out"  [place]  [place]  Current SI (including self-harm):  No  Current HI:  No  Consent to Intern Participation: N/Greene   Jeffery RancherMarjette Emmitte Greene, LRT/CTRS  Jeffery AbedLindsay, Jeffery Greene 07/28/2017, 2:50 PM

## 2017-07-28 NOTE — Progress Notes (Signed)
Patient ID: Jeffery Greene, male   DOB: [birth year], 21 y.o.   MRN: [id number]  Pt currently presents with a blunted affect and guarded, irritable behavior. Pt avoids eye contact with Clinical research associatewriter during assessment. Pt reports to Clinical research associatewriter that he is unable to think of a goal for tomorrow at this time. Pt states "I'm sleepy, I played basketball outside today." Pt reports good sleep with current medication regimen.   Pt provided with medications per providers orders. Pt's labs and vitals were monitored throughout the night. Pt given a 1:1 about emotional and mental status. Pt supported and encouraged to express concerns and questions. Pt educated on medications and setting realistic goals, needs reinforcement.   Pt's safety ensured with 15 minute and environmental checks. Pt currently denies SI/HI and A/V hallucinations. Pt verbally agrees to seek staff if SI/HI or A/VH occurs and to consult with staff before acting on any harmful thoughts. Will continue POC.

## 2017-07-28 NOTE — [facility] Group Notes (Addendum)
LCSW Group Therapy Note  07/28/2017 1:15pm  Type of Therapy/Topic:  Group Therapy:  Balance in Life  Participation Level:  Active   Description of Group:    This group will address the concept of balance and how it feels and looks when one is unbalanced. Patients will be encouraged to process areas in their lives that are out of balance and identify reasons for remaining unbalanced. Facilitators will guide patients in utilizing problem-solving interventions to address and correct the stressor making their life unbalanced. Understanding and applying boundaries will be explored and addressed for obtaining and maintaining a balanced life. Patients will be encouraged to explore ways to assertively make their unbalanced needs known to significant others in their lives, using other group members and facilitator for support and feedback.  Therapeutic Goals: 1. Patient will identify two or more emotions or situations they have that consume much of in their lives. 2. Patient will identify signs/triggers that life has become out of balance:  3. Patient will identify two ways to set boundaries in order to achieve balance in their lives:  4. Patient will demonstrate ability to communicate their needs through discussion and/or role plays  Summary of Patient Progress:   Jeffery Greene attended group but was pulled out by the doctor half way through the session.  He returned near the end of the group session. He shared that when he is angry he clinches his teeth and it feels like his head is on fire. He states that he does not have time to use meditation because he is busy with school work.  He also feels that he is not getting enough sleep at night. At the beginning of group he appeared anxious and angry.  He made little eye contact and did not contribute much to the discussion.  After speaking with the doctor he was happier and appeared to be ready to share more information about himself with the other group members.     Therapeutic Modalities:   Cognitive Behavioral Therapy Solution-Focused Therapy Assertiveness Training  Jeffery Greene, Student-Social Work 07/28/2017 11:44 AM

## 2017-07-28 NOTE — Progress Notes (Signed)
D: Pt A & O X4. Denies SI, HI and AVH when assessed. Presents anxious with blunted affect, demanded to be d/c earlier this shift "I don't need to be here, I'm only here because my therapist recommended it, I shouldn't have come, I'll like to leave today if possible". Per pt "I slept well with the sleeping pill the nurse gave me last night". Reports fair appetite and normal energy. Rates his depression 2/10, hopelessness 1/10 and anxiety 1/10. A: Scheduled and PRN medications administered as prescribed with verbal education and effects monitored. Writer informed pt of changes made to medication regimen. Encouraged pt to to comply with treatment regimen including groups. Routine safety checks maintained without self harm gestures.  R: Pt has been receptive to care. Compliant with medications when offered. Pt in agreement with medication changes. Attended groups. Off unit for recreational time with peers, returned without issues. POC remains effective for mood stability and safety.

## 2017-07-28 NOTE — Progress Notes (Signed)
Patient did not attend wrap up group. 
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
## 2017-07-29 NOTE — [facility] Group Notes (Signed)
LCSW Group Therapy 07/29/2017 1:15pm  Type of Therapy and Topic:  Group Therapy:  Change and Accountability  Participation Level:  Did Not Attend  Description of Group In this group, patients discussed power and accountability for change.  The group identified the challenges related to accountability and the difficulty of accepting the outcomes of negative behaviors.  Patients were encouraged to openly discuss a challenge/change they could take responsibility for.  Patients discussed the use of "change talk" and positive thinking as ways to support achievement of personal goals.  The group discussed ways to give support and empowerment to peers.  Therapeutic Goals: 1. Patients will state the relationship between personal power and accountability in the change process 2. Patients will identify the positive and negative consequences of a personal choice they have made 3. Patients will identify one challenge/choice they will take responsibility for making 4. Patients will discuss the role of "change talk" and the impact of positive thinking as it supports successful personal change 5. Patients will verbalize support and affirmation of change efforts in peers  Summary of Patient Progress:    Therapeutic Modalities Solution Focused Brief Therapy Motivational Interviewing Cognitive Behavioral Therapy  Jeffery Heraldngel M [name], Student-Social Work 07/29/2017 1:13 PM

## 2017-07-29 NOTE — Progress Notes (Signed)
Did not attend wrap up group

## 2017-07-29 NOTE — Progress Notes (Signed)
DAR NOTE: Patient presents with calm affect and pleasant mood.  Denies pain, auditory and visual hallucinations.  Described energy level as high and concentration as good.  Rates depression at 0, hopelessness at 0, and anxiety at 0.  Maintained on routine safety checks.  Medications given as prescribed.  Support and encouragement offered as needed.  States goal for today is "to work on getting back to school."  Patient observed socializing with peers in the dayroom.  Offered no complaint.

## 2017-07-29 NOTE — Plan of Care (Signed)
Problem: Safety: Goal: Periods of time without injury will increase Outcome: Progressing Patient is safe and free from injury.  Routine safety checks maintained every 15 minutes.   

## 2017-07-29 NOTE — Progress Notes (Signed)
Recreation Therapy Notes  Date: 07/29/17 Time: 1000 Location: 500 Hall Dayroom  Group Topic: Life Goals, Goal Setting  Goal Area(s) Addresses:  Patient will be able to identify at least 3 life goals.  Patient will be able to identify benefit of investing in life goals.  Patient will be able to identify benefit of setting life goals.   Intervention: Goal Planning Worksheet  Activity: Goal Planning.  Patients were given a worksheet in which they had to come up with a goal for next week, next month, next year and in five years.  Patients were to then identify the obstacles to reaching those goals, what they need to achieve their goals and what they can begin doing to work towards their goals.  Education: Discharge Planning, Coping Skills, Life Goals  Education Outcome: Acknowledges Education/In Group Clarification Provided/Needs Additional Education  Clinical Observations:  Pt did not attend group.   Caroll RancherMarjette Phala Schraeder, LRT/CTRS         Caroll RancherLindsay, Adeena Bernabe A 07/29/2017 11:48 AM

## 2017-07-29 NOTE — Progress Notes (Signed)
[facility]  07/29/2017 12:58 PM Jeffery GottronRiley Greene  MRN:  [id number] Subjective:   "I feel better today."  Jeffery Greene is a 21 y/o M with history of bipolar disorder with psychotic features who presented initially with worsened depression, anxiety, SI with plan to use carbon monoxide, paranoia, and AH. He was started on abilify and discontinued from home medication of depakote. Also home wellbutrin dose was decreased to 150mg  qDay. Pt reports that his mood symptoms have significantly improved [facility]. He denies SI/HI/AH/VH. He notes that his girlfriend came to visit him last night and the visit went well. He also reports that he spoke to his mother over the phone and they have been able to resolve their conflict. As both of these relationships were major strains on the patient, he expresses significant relief that he has been able to mend his relationships while in the hospital. He notes that gabapentin has been helpful for his lower back pain. He thinks that his current regimen is doing well and he would not like to make changes. Pt anticipates that he will be ready to discharge tomorrow. He had no further questions, comments, or concerns.  Principal Problem: depression and suicidal ideation Diagnosis:   Patient Active Problem List   Diagnosis Date Noted  . Bipolar disorder with psychotic features (HCC) [F31.9] 07/27/2017   Total Time spent with patient: 30 minutes  Past Psychiatric History: see H&P  Past Medical History:  Past Medical History:  Diagnosis Date  . Anxiety     Past Surgical History:  Procedure Laterality Date  . BACK SURGERY     Family History: History reviewed. No pertinent family history. Family Psychiatric  History: see H&P Social History:  History  Alcohol use Not on file     History  Drug Use  . Types: Cocaine, Marijuana    Comment: marijuana daily/cocaine occas    Social History   Social History  . Marital status: Single    Spouse  name: N/A  . Number of children: N/A  . Years of education: N/A   Social History Main Topics  . Smoking status: Current Every Day Smoker    Types: Cigarettes  . Smokeless tobacco: Current User  . Alcohol use None  . Drug use: Yes    Types: Cocaine, Marijuana     Comment: marijuana daily/cocaine occas  . Sexual activity: Yes    Birth control/ protection: None   Other Topics Concern  . None   Social History Narrative  . None   Additional Social History:    Pain Medications: see MAR Prescriptions: see MAR Over the Counter: see MAR History of alcohol / drug use?: Yes Name of Substance 1: alcohol 1 - Age of First Use: UTA 1 - Amount (size/oz): UTA 1 - Frequency: weekly 1 - Duration: UTA 1 - Last Use / Amount: using on the weekends with friends Name of Substance 2: cannabis- smoked in a pipe 2 - Age of First Use: UTA 2 - Amount (size/oz): 1 gram to 1.5 gram over a week or more 2 - Frequency: daily, through out the day 2 - Duration: years 2 - Last Use / Amount: today                Sleep: Good  Appetite:  Good  Current Medications: Current Facility-Administered Medications  Medication Dose Route Frequency Provider Last Rate Last Dose  . acetaminophen (TYLENOL) tablet 650 mg  650 mg Oral Q6H PRN Okonkwo, Justina A,  NP   650 mg at 07/28/17 0846  . alum & mag hydroxide-simeth (MAALOX/MYLANTA) 200-200-20 MG/5ML suspension 30 mL  30 mL Oral Q4H PRN Okonkwo, Justina A, NP      . ARIPiprazole (ABILIFY) tablet 5 mg  5 mg Oral Daily Nwoko, Agnes I, NP   5 mg at 07/29/17 0742  . buPROPion (WELLBUTRIN XL) 24 hr tablet 150 mg  150 mg Oral Daily Armandina Stammer I, NP   150 mg at 07/29/17 0742  . docusate sodium (COLACE) capsule 100 mg  100 mg Oral Daily Micheal Likens, MD   100 mg at 07/29/17 0742  . gabapentin (NEURONTIN) capsule 100 mg  100 mg Oral TID Armandina Stammer I, NP   100 mg at 07/29/17 1209  . hydrOXYzine (ATARAX/VISTARIL) tablet 25 mg  25 mg Oral Q6H PRN Armandina Stammer I, NP   25 mg at 07/28/17 2149  . magnesium hydroxide (MILK OF MAGNESIA) suspension 30 mL  30 mL Oral Daily PRN Okonkwo, Justina A, NP   30 mL at 07/29/17 0626  . traZODone (DESYREL) tablet 50 mg  50 mg Oral QHS PRN Beryle Lathe, Justina A, NP   50 mg at 07/28/17 2149    Lab Results:  Results for orders placed or performed during the hospital encounter of 07/27/17 (from the past 48 hour(s))  Ethanol     Status: None   Collection Time: 07/28/17  6:19 PM  Result Value Ref Range   Alcohol, Ethyl (B) <10 <10 mg/dL    Comment:        LOWEST DETECTABLE LIMIT FOR SERUM ALCOHOL IS 10 mg/dL FOR MEDICAL PURPOSES ONLY Performed at [facility], [address] [place], [place] [address]     Blood Alcohol level:  Lab Results  Component Value Date   ETH <10 07/28/2017    Metabolic Disorder Labs: No results found for: HGBA1C, MPG No results found for: PROLACTIN No results found for: CHOL, TRIG, HDL, CHOLHDL, VLDL, LDLCALC  Physical Findings: AIMS: Facial and Oral Movements Muscles of Facial Expression: None, normal Lips and Perioral Area: None, normal Jaw: None, normal Tongue: None, normal,Extremity Movements Upper (arms, wrists, hands, fingers): None, normal Lower (legs, knees, ankles, toes): None, normal, Trunk Movements Neck, shoulders, hips: None, normal, Overall Severity Severity of abnormal movements (highest score from questions above): None, normal Incapacitation due to abnormal movements: None, normal Patient's awareness of abnormal movements (rate only patient's report): No Awareness, Dental Status Current problems with teeth and/or dentures?: No Does patient usually wear dentures?: No  CIWA:    COWS:     Musculoskeletal: Strength & Muscle Tone: within normal limits Gait & Station: normal Patient leans: N/A  Psychiatric Specialty Exam: Physical Exam  Nursing note and vitals reviewed.   Review of Systems  Constitutional: Negative for chills and  fever.  Respiratory: Negative for cough.   Cardiovascular: Negative for chest pain and palpitations.  Gastrointestinal: Negative for abdominal pain, heartburn, nausea and vomiting.  Psychiatric/Behavioral: Negative for depression, hallucinations and suicidal ideas. The patient is not nervous/anxious.     Blood pressure 118/75, pulse 91, temperature 97.7 F (36.5 C), temperature source Oral, resp. rate 20, height 5\' 10"  (1.778 m), weight 84.4 kg (186 lb), SpO2 100 %.Body mass index is 26.69 kg/m.  General Appearance: Casual and Well Groomed  Eye Contact:  Good  Speech:  Clear and Coherent and Normal Rate  Volume:  Normal  Mood:  Euthymic  Affect:  Appropriate and Congruent  Thought Process:  Coherent and Goal Directed  Orientation:  Full (Time, Place, and Person)  Thought Content:  Logical  Suicidal Thoughts:  No  Homicidal Thoughts:  No  Memory:  Immediate;   Good Recent;   Good Remote;   Good  Judgement:  Good  Insight:  Good  Psychomotor Activity:  Normal  Concentration:  Concentration: Good and Attention Span: Good  Recall:  Good  Fund of Knowledge:  Good  Language:  Good  Akathisia:  No  Handed:    AIMS (if indicated):     Assets:  Communication Skills Desire for Improvement Financial Resources/Insurance Housing Intimacy Physical Health Resilience Social Support Talents/Skills Vocational/Educational  ADL's:  Intact  Cognition:  WNL  Sleep:  Number of Hours: 6.75     Treatment Plan Summary: Daily contact with patient to assess and evaluate symptoms and progress in treatment and Medication management  - Continue inpatient hospitalization and stabilization - Continue abilify 5mg  qDay - Continue wellbutrin 150mg  qDay - Continue gabapentin 100mg  TID - Continue atarax 25mg  q6h prn anxiety - trazodone 50mg  qhs prn insomnia - Encourage participation in groups and therapeutic milieu - Discharge planning will be ongoing, anticipate discharge tomorrow  07/30/17  Micheal Likens, MD 07/29/2017, 12:58 PM

## 2017-07-29 NOTE — Progress Notes (Addendum)
Patient ID: Jeffery Greene, male   DOB: 1995-10-06, 21 y.o.   MRN: [id number]  Pt notified that he had labs ordered this morning. Pt huffs, closes his eyes and states to writer "I gave blood yesterday, I'm not doing it again." Pt educated on lab collection indications, pt refused teaching. Lab notified, will pass on to dayshift nurse.

## 2017-07-30 MED ORDER — BUPROPION HCL ER (XL) 150 MG PO TB24: 150.0000 mg | ORAL_TABLET | Freq: Every day | ORAL | 0 refills | Status: AC

## 2017-07-30 MED ORDER — TRAZODONE HCL 50 MG PO TABS: 50.0000 mg | ORAL_TABLET | Freq: Every evening | ORAL | 0 refills | Status: AC | PRN

## 2017-07-30 MED ORDER — HYDROXYZINE HCL 25 MG PO TABS: 25.0000 mg | ORAL_TABLET | Freq: Four times a day (QID) | ORAL | 0 refills | Status: AC | PRN

## 2017-07-30 MED ORDER — GABAPENTIN 100 MG PO CAPS: 100.0000 mg | ORAL_CAPSULE | Freq: Three times a day (TID) | ORAL | 0 refills | Status: AC

## 2017-07-30 MED ORDER — DOCUSATE SODIUM 100 MG PO CAPS: 100.0000 mg | ORAL_CAPSULE | Freq: Every day | ORAL | 0 refills | Status: AC

## 2017-07-30 MED ORDER — ARIPIPRAZOLE 5 MG PO TABS: 5.0000 mg | ORAL_TABLET | Freq: Every day | ORAL | 0 refills | Status: AC

## 2017-07-30 NOTE — Progress Notes (Signed)
DAR Note   Pt at the time of assessment was in bed resting with eyes closed. Pt did not look to be in any distress at this time. Support, encouragement, and safe environment provided.  15-minute safety checks continue. Pt did not attend wrap up group.

## 2017-07-30 NOTE — Progress Notes (Signed)
Recreation Therapy Notes  Date: 07/30/17 Time: 1000 Location:  500 Hall  Group Topic: Communication  Goal Area(s) Addresses:  Patient will effectively communicate with peers in group.  Patient will verbalize benefit of healthy communication. Patient will verbalize positive effect of healthy communication on post d/c goals.  Patient will identify communication techniques that made activity effective for group.   Intervention:  Rubber discs  Activity: Traffic Jam.  Patients were given one rubber disc each, plus one extra for the group.  Patients were to use the discs to travel as a group from one end of the hall to the other and back.  If any person stepped off their disc, the group would have to start from the beginning.    Education:Communication, Discharge Planning  Education Outcome: Acknowledges understanding/In group clarification offered/Needs additional education.   Clinical Observations/Feedback: Pt did not attend group.   Reeve Mallo, LRT/CTRS         Kylynn Street A 07/30/2017 12:28 PM 

## 2017-07-30 NOTE — Progress Notes (Signed)
Recreation Therapy Notes  INPATIENT RECREATION TR PLAN  Patient Details Name: Wylan Gentzler MRN: [id number] DOB: [birth year] Today's Date: 07/30/2017  Rec Therapy Plan Is patient appropriate for Therapeutic Recreation?: Yes Treatment times per week: about 3 days Estimated Length of Stay: 5-7 days TR Treatment/Interventions: Group participation (Comment)  Discharge Criteria Pt will be discharged from therapy if:: Discharged Treatment plan/goals/alternatives discussed and agreed upon by:: Patient/family  Discharge Summary Short term goals set: Patient will be able to identify at least 5 coping skills for suicidal thoughts by conclusion of recreation therapy tx  Short term goals met: Not met Progress toward goals comments: Other (Comment) (None) Reason goals not met: Pt did not attend groups. Therapeutic equipment acquired: None Reason patient discharged from therapy: Discharge from hospital Pt/family agrees with progress & goals achieved: Yes Date patient discharged from therapy: 07/30/17   Victorino Sparrow, LRT/CTRS  Ria Comment, Costas Sena A 07/30/2017, 11:50 AM

## 2017-07-30 NOTE — Progress Notes (Signed)
Patient discharged to lobby. Patient was stable and appreciative at that time. All papers and prescriptions were given and valuables returned. Verbal understanding expressed. Denies SI/HI and A/VH. Patient given opportunity to express concerns and ask questions.  

## 2017-07-30 NOTE — BHH Suicide Risk Assessment (Signed)
[facility] Discharge Suicide Risk Assessment   Principal Problem: Bipolar disorder with psychotic features [facility]) Discharge Diagnoses:  Patient Active Problem List   Diagnosis Date Noted  . Bipolar disorder with psychotic features ([place]) [F31.9] 07/27/2017    Total Time spent with patient: 30 minutes  Musculoskeletal: Strength & Muscle Tone: within normal limits Gait & Station: normal Patient leans: N/A  Psychiatric Specialty Exam: Review of Systems  Constitutional: Negative for chills and fever.  Respiratory: Negative for cough and shortness of breath.   Cardiovascular: Negative for chest pain.  Gastrointestinal: Negative for abdominal pain, heartburn, nausea and vomiting.    Blood pressure 103/70, pulse 97, temperature 97.7 F (36.5 C), resp. rate 18, height '5\' 10"'$  (1.778 m), weight 84.4 kg (186 lb), SpO2 100 %.Body mass index is 26.69 kg/m.  General Appearance: Casual and Fairly Groomed  Eye Contact::  Good  Speech:  Clear and Coherent and Normal Rate409  Volume:  Normal  Mood:  Euthymic  Affect:  Appropriate, Congruent and Full Range  Thought Process:  Coherent  Orientation:  Full (Time, Place, and Person)  Thought Content:  Logical  Suicidal Thoughts:  No  Homicidal Thoughts:  No  Memory:  Immediate;   Good Recent;   Good Remote;   Good  Judgement:  Good  Insight:  Good  Psychomotor Activity:  Normal  Concentration:  Good  Recall:  Good  Fund of Knowledge:Good  Language: Good  Akathisia:  No  Handed:    AIMS (if indicated):     Assets:  Communication Skills Desire for Improvement Financial Resources/Insurance Social Support  Sleep:  Number of Hours: 6.75  Cognition: WNL  ADL's:  Intact   Mental Status Per Nursing Assessment::   On Admission:  Suicidal ideation indicated by patient  Demographic Factors:  Male, Adolescent or young adult, Caucasian and Unemployed  Loss Factors: Decrease in vocational status and Financial problems/change in socioeconomic  status  Historical Factors: Family history of mental illness or substance abuse and Impulsivity  Risk Reduction Factors:   Sense of responsibility to family, Living with another person, especially a relative, Positive social support, Positive therapeutic relationship and Positive coping skills or problem solving skills  Continued Clinical Symptoms:  Severe Anxiety and/or Agitation Bipolar Disorder:   Mixed State  Cognitive Features That Contribute To Risk:  None    Suicide Risk:  Mild:  Suicidal ideation of limited frequency, intensity, duration, and specificity.  There are no identifiable plans, no associated intent, mild dysphoria and related symptoms, good self-control (both objective and subjective assessment), few other risk factors, and identifiable protective factors, including available and accessible social support.  Follow-up [place], [facility] Follow up on 08/12/2017.   Why:  follow-up apts for medication management scheduled for 08/12/17 at 2:30P and Outpatient Psychotherapy for 09/03/17 at 1:00PM Contact information: [place] [address] [phone]           Subjective data: - Jeffery Greene is a 21 y/o M who presented with worsening depression, SI, paranoia, and possible AH. He has been doing well on the unit and denying all symptoms. He denies SI/HI/AH/VH. He has been tolerating current medication regimen without difficulty. He has been sleeping well. Appetite is good. He met with a family meeting with his father, Jeffery Greene, about the symptoms he has been experiencing and we discussed following up with his outpatient therapist and psychiatrist to address ongoing stressors. Pt was able to articulate a well-developed safety plan including returning  to [facility] or contacting emergency services if he feels unable to maintain his own safety. Pt agrees to continue his current treatment regimen without changes. He had no further  questions, comments, or concerns.   Plan Of Care/Follow-up recommendations:  -Bipolar disorder with psychotic features  - continue abilify '5mg'$  qDay  - continue wellbutrin '150mg'$  qDay -Anxiety  - continue gabapentin '100mg'$  po TID  - Continue atarax '25mg'$  q6h prn anxiety -Insomnia  - Continue trazodone '50mg'$  qhs prn insomnia  Activity:  as tolerated Diet:  normal Tests:  NA Other:  see above for discharge plan  Pennelope Bracken, MD 07/30/2017, 11:33 AM

## 2017-07-30 NOTE — Plan of Care (Signed)
Problem: [facility] Participation in Recreation Therapeutic Interventions Goal: STG-Patient will identify at least five coping skills for ** STG: Coping Skills - Patient will be able to identify at least 5 coping skills for suicidal thoughts by conclusion of recreation therapy tx  Outcome: Not Met (add Reason) Pt did not attend groups.  Victorino Sparrow, LRT/CTRS

## 2017-07-30 NOTE — Discharge Summary (Signed)
Physician Discharge Summary Note  Patient:  Jeffery Greene is an 21 y.o., male MRN:  [id number] DOB:  1996-08-10 Patient phone:  [phone] (home)   Patient address:   [place] [address],   Total Time spent with patient: Greater than 30 minutes  Date of Admission:  07/27/2017  Date of Discharge: 07-30-17  Reason for Admission: Suicidal ideations with plans to overdose on medications or Carbo monoxide suffocation.   Principal Problem: Bipolar disorder with psychotic features.  Discharge Diagnoses: Patient Active Problem List   Diagnosis Date Noted  . Bipolar disorder with psychotic features ([place]) [F31.9] 07/27/2017   Past Psychiatric History: Bipolar disorder with psychotic features.  Past Medical History:  Past Medical History:  Diagnosis Date  . Anxiety     Past Surgical History:  Procedure Laterality Date  . BACK SURGERY     Family History: History reviewed. No pertinent family history.  Family Psychiatric  History: See H&P  Social History:  History  Alcohol use Not on file     History  Drug Use  . Types: Cocaine, Marijuana    Comment: marijuana daily/cocaine occas    Social History   Social History  . Marital status: Single    Spouse name: N/A  . Number of children: N/A  . Years of education: N/A   Social History Main Topics  . Smoking status: Current Every Day Smoker    Types: Cigarettes  . Smokeless tobacco: Current User  . Alcohol use None  . Drug use: Yes    Types: Cocaine, Marijuana     Comment: marijuana daily/cocaine occas  . Sexual activity: Yes    Birth control/ protection: None   Other Topics Concern  . None   Social History Narrative  . None   Hospital Course: .This is an admission assessment for this 21 year old Caucasian male. 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 He came to the [facility] as a walk-in seeking treatment for worsening Bipolar disorder symptoms. Chart review indicated that Jeffery Greene  presented with complaints of suicidal thoughts with plans to overdose on medication or suffocation using Carbon Monoxide. There was a mention of what presents as ideas of reference, referencing the [employer]. During this assessment, Jeffery Greene presents, casually dressed, alert, oriented x 4 & uptight. He reports, "I came here because I was having suicidal thoughts. This has been going for a while 6-7 years. I have never had a plan or attempted suicide. I was born this way. I was diagnosed with Bipolar disorder 2 months ago. I was put on Depakote, Wellbutrin & Vyvanse. I was also diagnosed with ADHD at age 89. I went to the mood treatment center in [facility] for help because I felt like my depression & suicidal thoughts worsened. That was when they diagnosed me with having Bipolar disorder. I think that the Depakote is helping some, however, I have other issues going on & yesterday was just a bad day that made everything else crashed. I was fighting a lot with my parents, my girlfriend & I were arguing a lot. I feel insolated from everyone. I have this back pain that is constant, it is adding to my depression. I cannot get anyone to prescribe some pain medications. Now, I cannot stay in this hospital. I need to be discharged. I'm not like these patients, they are crazy, I'm not. So, can you discharge me?".  After the above admission assessment, Jeffery Greene was admitted for mood stabilization treatments. The  medication regimen targeting his presenting symptoms were discussed & with his consent initiated. The adverse effects of these medications were discussed & explained with Jeffery Greene present. He was medicated & discharged on; Abilify 5 mg for mood control, Wellbutrin XL 150 mg for depression, Gabapentin 100 mg for agitation/pain management, Hydroxyzine 25 mg prn for anxiety & Trazodone 50 mg for insomnia. He was also enrolled in & participated in the group counseling sessions being offered & held on this unit. He learned  coping skills. He presented no other significant health issues other than low back pain. He was placed on Neurontin for it. He tolerated his treatment regimen without any adverse effects or reactions reported.  Jeffery Greene is seen today by his attending psychiatrist for discharge evaluation. He stated that he is pleased that he sought help. Says he is tolerating his medications well. No withdrawal symptoms. No craving for substances (Cannabis). He is no longer feeling depressed. Describes normal energy and ability to think. Able to focus on task. No suicidal thoughts. No homicidal thoughts. No thoughts of violence. Patient reports normal biological functions.   The nursing staff reports that patient has been appropriate on the unit. Patient has been interacting well with peers & staff. No behavioral issues. Patient has not voiced any suicidal thoughts. Patient has not been observed to be internally stimulated or occupied. Patient has been adherent with treatment recommendations. Patient has been tolerating his medication well, denies any adverse reactions or side effects.   Patient was discussed at the treatment team meeting this morning. The team members feel that Jeffery Greene is back to his baseline level of function. The team agrees with plan to discharge Jeffery Greene today to continue mental health care on outpatient basis as noted below. Upon discharge, Jeffery Greene adamantly denies any SIHI, AVH, delusional thoughts, paranoia or substance withdrawal symptoms. He will continue further psychiatric follow-up care/medication management on an outpatient basis as noted below. He was provided with all the necessary information needed to make this appointment without problems. He left [facility] with all personal belongings in no apparent distress. Transportation per family.  Physical Findings: AIMS: Facial and Oral Movements Muscles of Facial Expression: None, normal Lips and Perioral Area: None, normal Jaw: None, normal Tongue: None,  normal,Extremity Movements Upper (arms, wrists, hands, fingers): None, normal Lower (legs, knees, ankles, toes): None, normal, Trunk Movements Neck, shoulders, hips: None, normal, Overall Severity Severity of abnormal movements (highest score from questions above): None, normal Incapacitation due to abnormal movements: None, normal Patient's awareness of abnormal movements (rate only patient's report): No Awareness, Dental Status Current problems with teeth and/or dentures?: No Does patient usually wear dentures?: No  CIWA:    COWS:     Musculoskeletal: Strength & Muscle Tone: within normal limits Gait & Station: normal Patient leans: N/A  Psychiatric Specialty Exam: Physical Exam  Constitutional: He appears well-developed.  HENT:  Head: Normocephalic.  Eyes: Pupils are equal, round, and reactive to light.  Neck: Normal range of motion.  Cardiovascular: Normal rate.   Respiratory: Effort normal.  GI: Soft.  Genitourinary:  Genitourinary Comments: Deferred  Musculoskeletal: Normal range of motion.  Neurological: He is alert.  Skin: Skin is warm.    Review of Systems  Constitutional: Negative.   HENT: Negative.   Eyes: Negative.   Respiratory: Negative.   Cardiovascular: Negative.   Gastrointestinal: Negative.   Genitourinary: Negative.   Musculoskeletal: Negative.   Skin: Negative.   Neurological: Negative.   Endo/Heme/Allergies: Negative.   Psychiatric/Behavioral: Positive for depression (Stabilized with  medication prior to discharge), hallucinations (Hx. auditory hallucinations, paranoia) and substance abuse (Polysubstance use disorder). Negative for memory loss and suicidal ideas. The patient has insomnia (Stabilized with medication prior to discharge.). The patient is not nervous/anxious.     Blood pressure 103/70, pulse 97, temperature 97.7 F (36.5 C), resp. rate 18, height '5\' 10"'$  (1.778 m), weight 84.4 kg (186 lb), SpO2 100 %.Body mass index is 26.69 kg/m.  See  [place] SRA   Have you used any form of tobacco in the last 30 days? (Cigarettes, Smokeless Tobacco, Cigars, and/or Pipes): Yes  Has this patient used any form of tobacco in the last 30 days? (Cigarettes, Smokeless Tobacco, Cigars, and/or Pipes): N/A  Blood Alcohol level:  Lab Results  Component Value Date   ETH <10 16/07/9603   Metabolic Disorder Labs:  No results found for: HGBA1C, MPG No results found for: PROLACTIN No results found for: CHOL, TRIG, HDL, CHOLHDL, VLDL, LDLCALC  See Psychiatric Specialty Exam and Suicide Risk Assessment completed by Attending Physician prior to discharge.  Discharge destination:  Home  Is patient on multiple antipsychotic therapies at discharge:  No   Has Patient had three or more failed trials of antipsychotic monotherapy by history:  No  Recommended Plan for Multiple Antipsychotic Therapies: NA  Allergies as of 07/30/2017      Reactions   Other    Tree Nuts      Medication List    STOP taking these medications   buPROPion 150 MG 12 hr tablet Commonly known as:  WELLBUTRIN SR Replaced by:  buPROPion 150 MG 24 hr tablet   divalproex 500 MG DR tablet Commonly known as:  DEPAKOTE   eszopiclone 2 MG Tabs tablet Commonly known as:  LUNESTA   lisdexamfetamine 60 MG capsule Commonly known as:  VYVANSE     TAKE these medications     Indication  ARIPiprazole 5 MG tablet Commonly known as:  ABILIFY Take 1 tablet (5 mg total) by mouth daily. For mood control  Indication:  Mood control   buPROPion 150 MG 24 hr tablet Commonly known as:  WELLBUTRIN XL Take 1 tablet (150 mg total) by mouth daily. For depression Replaces:  buPROPion 150 MG 12 hr tablet  Indication:  Major Depressive Disorder   docusate sodium 100 MG capsule Commonly known as:  COLACE Take 1 capsule (100 mg total) by mouth daily. (May purchase from over the counter): For constipation  Indication:  Constipation   gabapentin 100 MG capsule Commonly known as:   NEURONTIN Take 1 capsule (100 mg total) by mouth 3 (three) times daily. For agitation/pain management  Indication:  Agitation, pain mangement   hydrOXYzine 25 MG tablet Commonly known as:  ATARAX/VISTARIL Take 1 tablet (25 mg total) by mouth every 6 (six) hours as needed for anxiety.  Indication:  Feeling Anxious   traZODone 50 MG tablet Commonly known as:  DESYREL Take 1 tablet (50 mg total) by mouth at bedtime as needed for sleep.  Indication:  Jeffery Greene, [facility] Follow up on 08/12/2017.   Why:  follow-up apts for medication management scheduled for 08/12/17 at 2:30P and Outpatient Psychotherapy for 09/03/17 at 1:00PM Contact information: [place] [address] [phone]          Follow-up recommendations: Activity:  As tolerated Diet: As recommended by your primary care doctor. Keep all scheduled follow-up appointments as recommended.   Comments: Patient is instructed  prior to discharge to: Take all medications as prescribed by his/her mental healthcare provider. Report any adverse effects and or reactions from the medicines to his/her outpatient provider promptly. Patient has been instructed & cautioned: To not engage in alcohol and or illegal drug use while on prescription medicines. In the event of worsening symptoms, patient is instructed to call the crisis hotline, 911 and or go to the nearest ED for appropriate evaluation and treatment of symptoms. To follow-up with his/her primary care provider for your other medical issues, concerns and or health care needs.   Signed: Encarnacion Slates, NP, PMHNP, FNP-BC 07/30/2017, 10:32 AM   Patient seen, Suicide Assessment Completed.  Disposition Plan Reviewed   - Jeffery Greene is a 21 y/o M who presented with worsening depression, SI, paranoia, and possible AH. He has been doing well on the unit and denying all symptoms. He denies SI/HI/AH/VH. He has been  tolerating current medication regimen without difficulty. He has been sleeping well. Appetite is good. He met with a family meeting with his father, Jeffery Greene, about the symptoms he has been experiencing and we discussed following up with his outpatient therapist and psychiatrist to address ongoing stressors. Pt was able to articulate a well-developed safety plan including returning to [facility] or contacting emergency services if he feels unable to maintain his own safety. Pt agrees to continue his current treatment regimen without changes. He had no further questions, comments, or concerns.   Plan Of Care/Follow-up recommendations:  -Bipolar disorder with psychotic features             - continue abilify '5mg'$  qDay             - continue wellbutrin '150mg'$  qDay -Anxiety             - continue gabapentin '100mg'$  po TID             - Continue atarax '25mg'$  q6h prn anxiety -Insomnia             - Continue trazodone '50mg'$  qhs prn insomnia  Activity:  as tolerated Diet:  normal Tests:  NA Other:  see above for discharge plan  Pennelope Bracken, MD

## 2017-07-30 NOTE — Progress Notes (Signed)
  [facility]  Will you be returning to the same living situation after discharge:  Yes,  Home At discharge, do you have transportation home?: Yes,  Family  Do you have the ability to pay for your medications: Yes,  Family   Release of information consent forms completed and in the chart;  Patient's signature needed at discharge.  Patient to Follow up at: Follow-up Information    [facility] Follow up on 08/12/2017.   Why:  follow-up apts for medication management scheduled for 08/12/17 at 2:30P and Outpatient Psychotherapy for 09/03/17 at 1:00PM Contact information: [address] [place] [address] [phone]           Next level of care provider [facility] Link:no  Safety Planning and Suicide Prevention discussed: Yes,  Yes  Have you used any form of tobacco in the last 30 days? (Cigarettes, Smokeless Tobacco, Cigars, and/or Pipes): Yes  Has patient been referred to the Quitline?: Patient refused referral  Patient has been referred for addiction treatment: Pt. refused referral  Aram BeechamAngel M Etheridge Geil, Student-Social Work 07/30/2017, 1:07 PM

## 2017-08-24 ENCOUNTER — Encounter (HOSPITAL_BASED_OUTPATIENT_CLINIC_OR_DEPARTMENT_OTHER): Payer: Self-pay | Admitting: *Deleted

## 2017-08-24 ENCOUNTER — Emergency Department (HOSPITAL_BASED_OUTPATIENT_CLINIC_OR_DEPARTMENT_OTHER): Payer: PRIVATE HEALTH INSURANCE

## 2017-08-24 ENCOUNTER — Emergency Department (HOSPITAL_BASED_OUTPATIENT_CLINIC_OR_DEPARTMENT_OTHER)
Admission: EM | Admit: 2017-08-24 | Discharge: 2017-08-24 | Disposition: A | Discharge status: Home / Self Care | Payer: PRIVATE HEALTH INSURANCE | Attending: Emergency Medicine | Admitting: Emergency Medicine

## 2017-08-24 DIAGNOSIS — Z79891 Long term (current) use of opiate analgesic: Secondary | ICD-10-CM | POA: Insufficient documentation

## 2017-08-24 DIAGNOSIS — W19XXXA Unspecified fall, initial encounter: Secondary | ICD-10-CM

## 2017-08-24 DIAGNOSIS — Z79899 Other long term (current) drug therapy: Secondary | ICD-10-CM | POA: Insufficient documentation

## 2017-08-24 DIAGNOSIS — F17210 Nicotine dependence, cigarettes, uncomplicated: Secondary | ICD-10-CM | POA: Insufficient documentation

## 2017-08-24 DIAGNOSIS — Y929 Unspecified place or not applicable: Secondary | ICD-10-CM | POA: Insufficient documentation

## 2017-08-24 DIAGNOSIS — Y9351 Activity, roller skating (inline) and skateboarding: Secondary | ICD-10-CM | POA: Insufficient documentation

## 2017-08-24 DIAGNOSIS — S8391XA Sprain of unspecified site of right knee, initial encounter: Secondary | ICD-10-CM

## 2017-08-24 DIAGNOSIS — S8991XA Unspecified injury of right lower leg, initial encounter: Secondary | ICD-10-CM | POA: Diagnosis present

## 2017-08-24 DIAGNOSIS — Y999 Unspecified external cause status: Secondary | ICD-10-CM | POA: Insufficient documentation

## 2017-08-24 DIAGNOSIS — T07XXXA Unspecified multiple injuries, initial encounter: Secondary | ICD-10-CM

## 2017-08-24 MED ORDER — NAPROXEN 375 MG PO TABS: 375.0000 mg | ORAL_TABLET | Freq: Two times a day (BID) | ORAL | 0 refills | Status: AC | PRN

## 2017-08-24 MED ORDER — HYDROCODONE-ACETAMINOPHEN 5-325 MG PO TABS: 1.0000 | ORAL_TABLET | Freq: Four times a day (QID) | ORAL | 0 refills | Status: AC | PRN

## 2017-08-24 MED ORDER — NAPROXEN 250 MG PO TABS
500.0000 mg | ORAL_TABLET | Freq: Once | ORAL | Status: AC
  Administered 2017-08-24: 500 mg via ORAL
  Filled 2017-08-24: qty 2

## 2017-08-24 NOTE — ED Provider Notes (Addendum)
[facility] Provider Note: Lowella DellJ. Lane Laithan Conchas, MD, FACEP  CSN: [id number] MRN: [id number] ARRIVAL: 08/24/17 at 0319 ROOM: [facility]   CHIEF COMPLAINT  Knee Injury   HISTORY OF PRESENT ILLNESS  08/24/17 5:45 AM Jeffery Greene is a [age] y.o. male who was skateboarding yesterday afternoon about 5 PM.  Fell twisting his right knee.  There was no significant immediate pain but he developed a gradual onset of severe pain in his right knee.  The pain is over the right medial knee.  There is no gross instability or ecchymosis.  There is no distal numbness or functional defect.  He has multiple ecchymoses to both lower extremities but states that these are not significantly painful and he frequently bruises himself when skateboarding.  He denies other injury.  The pain is severe enough that he was unable to sleep overnight.  He has not taken anything for the pain.  [facility] [place] [facility] reveals the patient has received no opioid pain prescriptions in the past year.   Past Medical History:  Diagnosis Date  . Anxiety     Past Surgical History:  Procedure Laterality Date  . BACK SURGERY      No family history on file.  Social History   Tobacco Use  . Smoking status: Current Every Day Smoker    Types: Cigarettes  . Smokeless tobacco: Current User  Substance Use Topics  . Alcohol use: Not on file  . Drug use: Yes    Types: Cocaine, Marijuana    Comment: marijuana daily/cocaine occas    Prior to Admission medications   Medication Sig Start Date End Date Taking? Authorizing Provider  ARIPiprazole (ABILIFY) 5 MG tablet Take 1 tablet (5 mg total) by mouth daily. For mood control 07/31/17   Armandina StammerNwoko, Agnes I, NP  buPROPion (WELLBUTRIN XL) 150 MG 24 hr tablet Take 1 tablet (150 mg total) by mouth daily. For depression 07/31/17   Armandina StammerNwoko, Agnes I, NP  docusate sodium (COLACE) 100 MG capsule Take 1 capsule (100 mg total) by mouth daily.  (May purchase from over the counter): For constipation 07/31/17   Armandina StammerNwoko, Agnes I, NP  gabapentin (NEURONTIN) 100 MG capsule Take 1 capsule (100 mg total) by mouth 3 (three) times daily. For agitation/pain management 07/30/17   Armandina StammerNwoko, Agnes I, NP  hydrOXYzine (ATARAX/VISTARIL) 25 MG tablet Take 1 tablet (25 mg total) by mouth every 6 (six) hours as needed for anxiety. 07/30/17   Armandina StammerNwoko, Agnes I, NP  traZODone (DESYREL) 50 MG tablet Take 1 tablet (50 mg total) by mouth at bedtime as needed for sleep. 07/30/17   Armandina StammerNwoko, Agnes I, NP    Allergies Other   REVIEW OF SYSTEMS  Negative except as noted here or in the History of Present Illness.   PHYSICAL EXAMINATION  Initial Vital Signs Blood pressure 123/66, pulse (!) 103, temperature 98.4 F (36.9 C), temperature source Oral, resp. rate 16, height 5\' 11"  (1.803 m), weight 83.9 kg (185 lb), SpO2 100 %.  Examination General: Well-developed, well-nourished male in no acute distress; appearance consistent with age of record HENT: normocephalic; atraumatic Eyes: pupils equal, round and reactive to light; extraocular muscles intact Neck: supple; no C-spine tenderness Heart: regular rate and rhythm Lungs: clear to auscultation bilaterally Abdomen: soft; nondistended; nontender; bowel sounds present Extremities: No deformity; full range of motion except right knee pain; pulses normal; tenderness over medial collateral ligament of right knee with pain on attempted range of motion, right lower  extremity distally neurovascularly intact Neurologic: Awake, alert and oriented; motor function intact in all extremities and symmetric; no facial droop Skin: Warm and dry Psychiatric: Normal mood and affect   RESULTS  Summary of this visit's results, reviewed by myself:   EKG Interpretation  Date/Time:    Ventricular Rate:    PR Interval:    QRS Duration:   QT Interval:    QTC Calculation:   R Axis:     Text Interpretation:        Laboratory  Studies: No results found for this or any previous visit (from the past 24 hour(s)). Imaging Studies: Dg Knee Complete 4 Views Right  Result Date: 08/24/2017 CLINICAL DATA:  Patient fell off a skateboard 12 hours ago. Anterior and medial right knee pain. Unable to walk or move the leg. EXAM: RIGHT KNEE - COMPLETE 4+ VIEW COMPARISON:  None. FINDINGS: No evidence of fracture, dislocation, or joint effusion. No evidence of arthropathy or other focal bone abnormality. Soft tissues are unremarkable. IMPRESSION: Negative. Electronically Signed   By: Burman NievesWilliam  Stevens M.D.   On: 08/24/2017 04:29    ED COURSE  Nursing notes and initial vitals signs, including pulse oximetry, reviewed.  Vitals:   08/24/17 0346 08/24/17 0348  BP:  123/66  Pulse:  (!) 103  Resp:  16  Temp:  98.4 F (36.9 C)  TempSrc:  Oral  SpO2:  100%  Weight: 83.9 kg (185 lb)   Height: 5\' 11"  (1.803 m)     PROCEDURES    ED DIAGNOSES     ICD-10-CM   1. Sprain of right knee, unspecified ligament, initial encounter S83.91XA   2. Fall W19.XXXA DG Knee Complete 4 Views Right    DG Knee Complete 4 Views Right  3. Contusion, multiple sites [id number]        Isha Seefeld, MD 08/24/17 [id number]    Paula LibraMolpus, Zaire Vanbuskirk, MD 08/24/17 [phone]

## 2017-08-24 NOTE — ED Triage Notes (Signed)
[place] [employer] c/o rt knee pain,

## 2018-08-06 ENCOUNTER — Emergency Department (HOSPITAL_BASED_OUTPATIENT_CLINIC_OR_DEPARTMENT_OTHER)
Admission: EM | Admit: 2018-08-06 | Discharge: 2018-08-06 | Disposition: A | Discharge status: Home / Self Care | Payer: PRIVATE HEALTH INSURANCE | Attending: Emergency Medicine | Admitting: Emergency Medicine

## 2018-08-06 ENCOUNTER — Encounter (HOSPITAL_BASED_OUTPATIENT_CLINIC_OR_DEPARTMENT_OTHER): Payer: Self-pay | Admitting: Emergency Medicine

## 2018-08-06 ENCOUNTER — Other Ambulatory Visit: Payer: Self-pay

## 2018-08-06 ENCOUNTER — Emergency Department (HOSPITAL_BASED_OUTPATIENT_CLINIC_OR_DEPARTMENT_OTHER): Payer: PRIVATE HEALTH INSURANCE

## 2018-08-06 DIAGNOSIS — Y9372 Activity, wrestling: Secondary | ICD-10-CM | POA: Diagnosis not present

## 2018-08-06 DIAGNOSIS — F17210 Nicotine dependence, cigarettes, uncomplicated: Secondary | ICD-10-CM | POA: Insufficient documentation

## 2018-08-06 DIAGNOSIS — X509XXA Other and unspecified overexertion or strenuous movements or postures, initial encounter: Secondary | ICD-10-CM | POA: Insufficient documentation

## 2018-08-06 DIAGNOSIS — Y9289 Other specified places as the place of occurrence of the external cause: Secondary | ICD-10-CM | POA: Diagnosis not present

## 2018-08-06 DIAGNOSIS — Y999 Unspecified external cause status: Secondary | ICD-10-CM | POA: Diagnosis not present

## 2018-08-06 DIAGNOSIS — Z79899 Other long term (current) drug therapy: Secondary | ICD-10-CM | POA: Diagnosis not present

## 2018-08-06 DIAGNOSIS — S93402A Sprain of unspecified ligament of left ankle, initial encounter: Secondary | ICD-10-CM | POA: Diagnosis not present

## 2018-08-06 DIAGNOSIS — S99912A Unspecified injury of left ankle, initial encounter: Secondary | ICD-10-CM | POA: Diagnosis present

## 2018-08-06 MED ORDER — HYDROCODONE-ACETAMINOPHEN 5-325 MG PO TABS
2.0000 | ORAL_TABLET | Freq: Once | ORAL | Status: AC
  Administered 2018-08-06: 2 via ORAL
  Filled 2018-08-06: qty 2

## 2018-08-06 NOTE — ED Notes (Signed)
Pt/family verbalized understanding of discharge instructions.   

## 2018-08-06 NOTE — ED Triage Notes (Signed)
L ankle pain that occurred while wrestling with his friends.

## 2018-08-06 NOTE — Discharge Instructions (Signed)
Follow up with your Primary Care Doctor as needed.  ° °Follow up with referred orthopedic doctor in 1-2 weeks if no improvement of pain.  ° °You can take Tylenol or Ibuprofen as directed for pain. You can alternate Tylenol and Ibuprofen every 4 hours. If you take Tylenol at 1pm, then you can take Ibuprofen at 5pm. Then you can take Tylenol again at 9pm. Do not exceed 4000 mg of tylenol a day. Do not exceed 800 mg of ibuprofen a day.  ° °  °Return to the Emergency Department immediately for any worsening pain, redness/swelling of the ankle, gray or blue color to the toes, numbness/weakness of toes or foot, difficulty walking or any other worsening or concerning symptoms.  ° ° °Ankle sprain °Ankle sprain occurs when the ligaments that hold the ankle joint to get her are stretched or torn. It may take 4-6 weeks to heal. ° °For activity: Use crutches with nonweightbearing for the first few days. Then, you may walk on your ankles as the pain allows, or as instructed. Start gradually with weight bearing on the affected ankle. Once you can walk pain free, then try jogging. When you can run forwards, then you can try moving side to side. If you cannot walk without crutches in one week, you need a recheck by your Family Doctor. ° °If you do not have a family doctor to followup with, you can see the list of phone numbers below. Please call today to make a followup appointment. ° ° °RICE therapy:  Routine Care for injuries ° °Rest, Ice, Compression, Elevation (RICE) ° °Rest is needed to allow your body to heal. Routine activities can be resumed when comfortable. Injury tendons and bones can take up to 6 weeks to heal. Tendons are cordlike structures that attach muscles and bones. ° °Ice following an injury helps keep the swelling down and reduce the pain. Put ice in a plastic bag. Place a towel between your skin and the bag of ice. Leave the ice on for 15-20 minutes, 3-4 times a day. Do this while awake, for the first 24-48  hours. After that continue as directed by your caregiver. ° °Compression helps keep swelling down. It also gives support and helps with discomfort. If any lasting bandage has been applied, it should be removed and reapplied every 3-4 hours. It should not be applied tightly, but firmly enough to keep swelling down. Watch fingers or toes for swelling, discoloration, coldness, numbness or excessive pain. If any of these problems occur, removed the bandage and reapply loosely. Contact your caregiver if these problems continue. ° °Elevation helps reduce swelling and decrease your pain. With extremities such as the arms, hands, legs and feet, the injured area should be placed near or above the level of the heart if possible. ° ° ° °

## 2018-08-06 NOTE — ED Provider Notes (Signed)
[facility] EMERGENCY DEPARTMENT Provider Note   CSN: [id number] Arrival date & time: 08/06/18  1753     History   Chief Complaint Chief Complaint  Patient presents with  . Ankle Pain    HPI Jhan Conery is a 22 y.o. male presents for evaluation of left ankle pain that began last night.  Patient reports he was wrestling with friends when he twisted his ankle.  He is not sure how he twisted it.  He reports since then he has not been able to ambulate or bear weight on the ankle.  He reports that pain worse today, prompting ED visit.  Additionally, he has had swelling to the ankle.  He has not taken any medications.  She denies any numbness/weakness.  The history is provided by the patient.    Past Medical History:  Diagnosis Date  . Anxiety     Patient Active Problem List   Diagnosis Date Noted  . Bipolar disorder with psychotic features (HCC) 07/27/2017    Past Surgical History:  Procedure Laterality Date  . BACK SURGERY          Home Medications    Prior to Admission medications   Medication Sig Start Date End Date Taking? Authorizing Provider  ARIPiprazole (ABILIFY) 5 MG tablet Take 1 tablet (5 mg total) by mouth daily. For mood control 07/31/17   Armandina Stammer I, NP  buPROPion (WELLBUTRIN XL) 150 MG 24 hr tablet Take 1 tablet (150 mg total) by mouth daily. For depression 07/31/17   Armandina Stammer I, NP  docusate sodium (COLACE) 100 MG capsule Take 1 capsule (100 mg total) by mouth daily. (May purchase from over the counter): For constipation 07/31/17   Armandina Stammer I, NP  gabapentin (NEURONTIN) 100 MG capsule Take 1 capsule (100 mg total) by mouth 3 (three) times daily. For agitation/pain management 07/30/17   Armandina Stammer I, NP  HYDROcodone-acetaminophen (NORCO) 5-325 MG tablet Take 1 tablet every 6 (six) hours as needed by mouth for severe pain. 08/24/17   Molpus, John, MD  hydrOXYzine (ATARAX/VISTARIL) 25 MG tablet Take 1 tablet (25 mg total) by mouth  every 6 (six) hours as needed for anxiety. 07/30/17   Armandina Stammer I, NP  naproxen (NAPROSYN) 375 MG tablet Take 1 tablet (375 mg total) 2 (two) times daily as needed by mouth (for knee pain). 08/24/17   Molpus, John, MD  traZODone (DESYREL) 50 MG tablet Take 1 tablet (50 mg total) by mouth at bedtime as needed for sleep. 07/30/17   Sanjuana Kava, NP    Family History No family history on file.  Social History Social History   Tobacco Use  . Smoking status: Current Every Day Smoker    Types: Cigarettes  . Smokeless tobacco: Current User  Substance Use Topics  . Alcohol use: Not on file  . Drug use: Yes    Types: Cocaine, Marijuana    Comment: marijuana daily/cocaine occas     Allergies   Other   Review of Systems Review of Systems  Musculoskeletal:       Ankle pain  Neurological: Negative for weakness and numbness.  All other systems reviewed and are negative.    Physical Exam Updated Vital Signs BP 119/78 (BP Location: Right Arm)   Pulse 89   Temp 98.3 F (36.8 C) (Oral)   Resp 17   Ht 5\' 11"  (1.803 m)   Wt 86.2 kg   SpO2 99%   BMI 26.50 kg/m   Physical  Exam  Constitutional: He appears well-developed and well-nourished.  HENT:  Head: Normocephalic and atraumatic.  Eyes: EOM are normal.  Neck: Normal range of motion.  Cardiovascular:  Pulses:      Dorsalis pedis pulses are 2+ on the right side, and 2+ on the left side.  Pulmonary/Chest: Effort normal.  Musculoskeletal:  Tenderness to palpation to the lateral aspect of the right ankle overlying edema and ecchymosis.  No warmth, or erythema. No deformity or crepitus noted.  No tenderness palpation of the distal tib-fib, proximal tib-fib.  Dorsiflexion and plantarflexion intact but with subjective reports of pain.  No tenderness palpation noted to right lower extremity.  Neurological:  Sensation intact throughout all major nerve distributions of the feet   Skin: Skin is warm and dry. Capillary refill takes  less than 2 seconds.  The skin is intact to ankle/foot.  The foot is warm and well perfused with intact sensation  Nursing note and vitals reviewed.    ED Treatments / Results  Labs (all labs ordered are listed, but only abnormal results are displayed) Labs Reviewed - No data to display  EKG None  Radiology Dg Ankle Complete Left  Result Date: 08/06/2018 CLINICAL DATA:  Left ankle pain EXAM: LEFT ANKLE COMPLETE - 3+ VIEW COMPARISON:  None. FINDINGS: Mild soft tissue swelling is noted laterally. No acute fracture or dislocation is seen. No other focal abnormality is noted. IMPRESSION: Mild soft tissue swelling without acute bony abnormality. Electronically Signed   By: Alcide Clever M.D.   On: 08/06/2018 19:06    Procedures Procedures (including critical care time)  Medications Ordered in ED Medications  HYDROcodone-acetaminophen (NORCO/VICODIN) 5-325 MG per tablet 2 tablet (2 tablets Oral Given 08/06/18 1914)     Initial Impression / Assessment and Plan / ED Course  I have reviewed the triage vital signs and the nursing notes.  Pertinent labs & imaging results that were available during my care of the patient were reviewed by me and considered in my medical decision making (see chart for details).     22 y.o. M presents with right ankle pain consistent with an ankle sprain/strain.  Vital signs reviewed and stable. Patient is neurovascularly intact. Consider sprain vs fracture vs dislocation.  XRs ordered.   XR reviewed. Negative for any acute fracture or dislocation. Explained results to patient and discussed that there could be a ligamentous or muscular injury that cannot be picked up on XR. Plan to send patient home with a air splint and crutches and will provide ortho referral to be seen if there is no improvement in symptoms. Plan to provide outpatient orthopedic referral for further follow-up and evaluation. Patient had ample opportunity for questions and discussion. All  patient's questions were answered with full understanding. Strict return precautions discussed. Patient expresses understanding and agreement to plan.    Final Clinical Impressions(s) / ED Diagnoses   Final diagnoses:  Sprain of left ankle, unspecified ligament, initial encounter    ED Discharge Orders    None       Rosana Hoes 08/06/18 Ninfa Linden    Arby Barrette, MD 08/11/18 1435

## 2018-08-06 NOTE — ED Notes (Signed)
Patient transported to X-ray 

## 2018-11-26 IMAGING — DX DG KNEE COMPLETE 4+V*R*
5 series · 5 of 5 positions shown · non-contrast
Comparison: None.

CLINICAL DATA: Patient fell off a skateboard 12 hours ago. Anterior
and medial right knee pain. Unable to walk or move the leg.

EXAM:
RIGHT KNEE - COMPLETE 4+ VIEW

[knee lat]
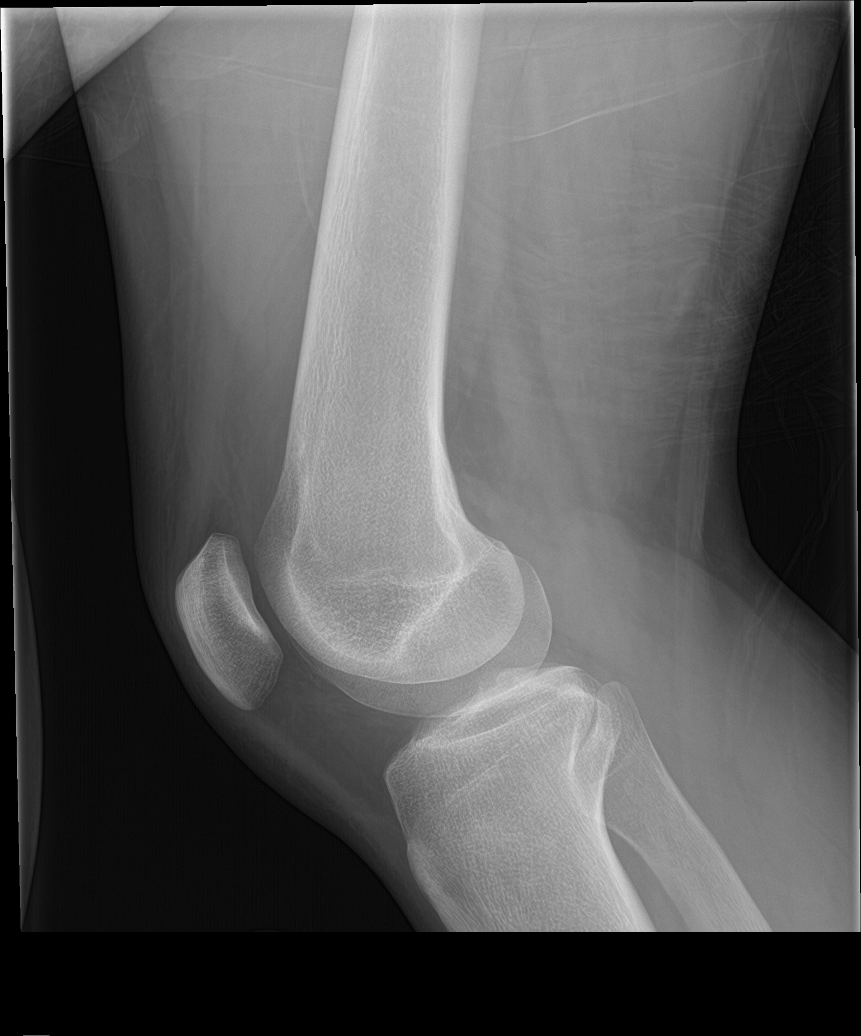

[knee obl (1 of 4)]
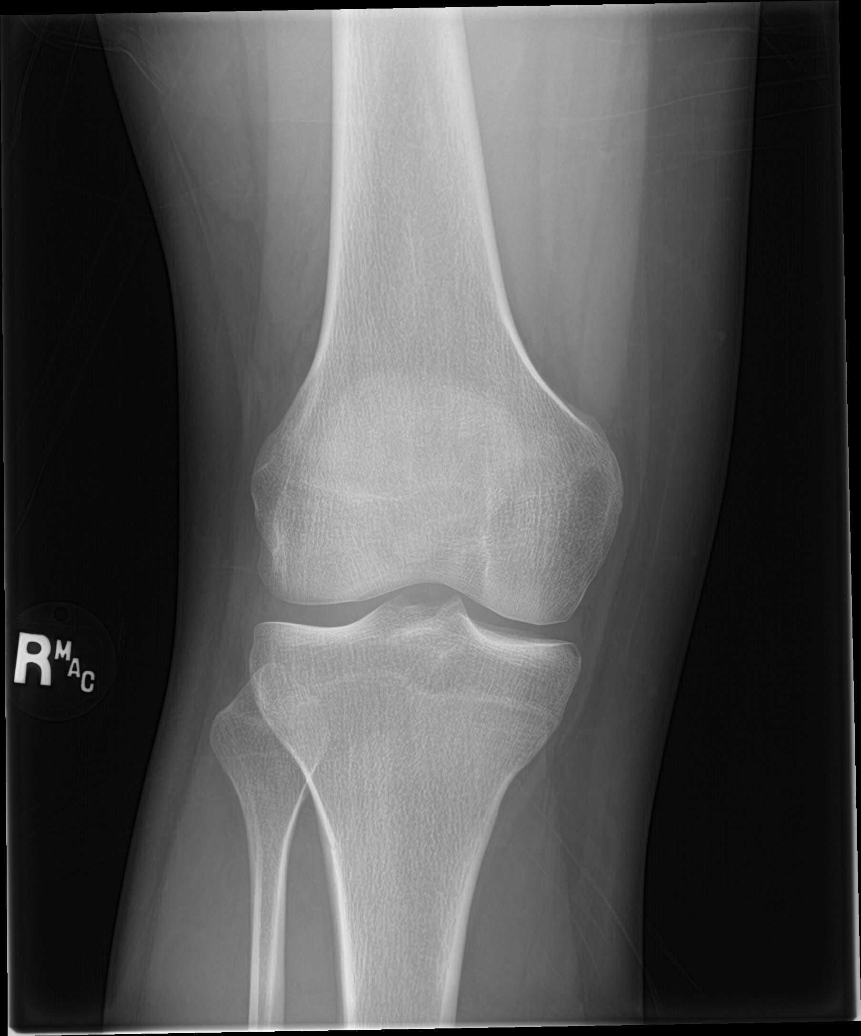

[knee obl (2 of 4)]
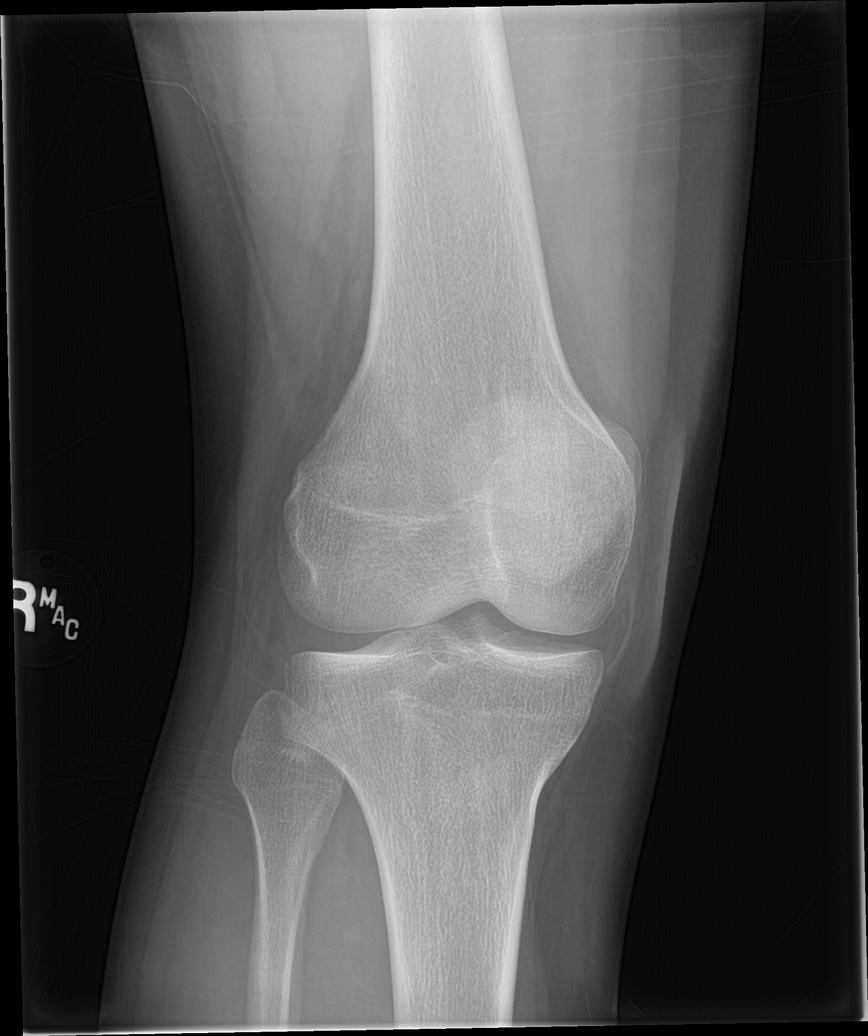

[knee obl (3 of 4)]
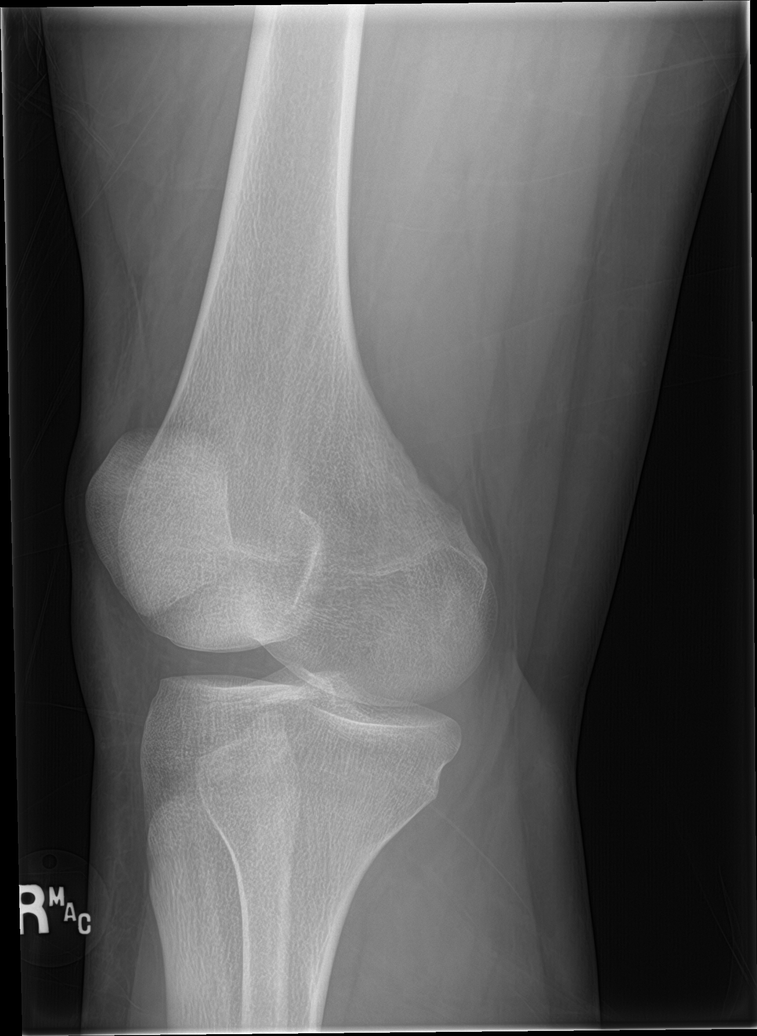

[knee obl (4 of 4)]
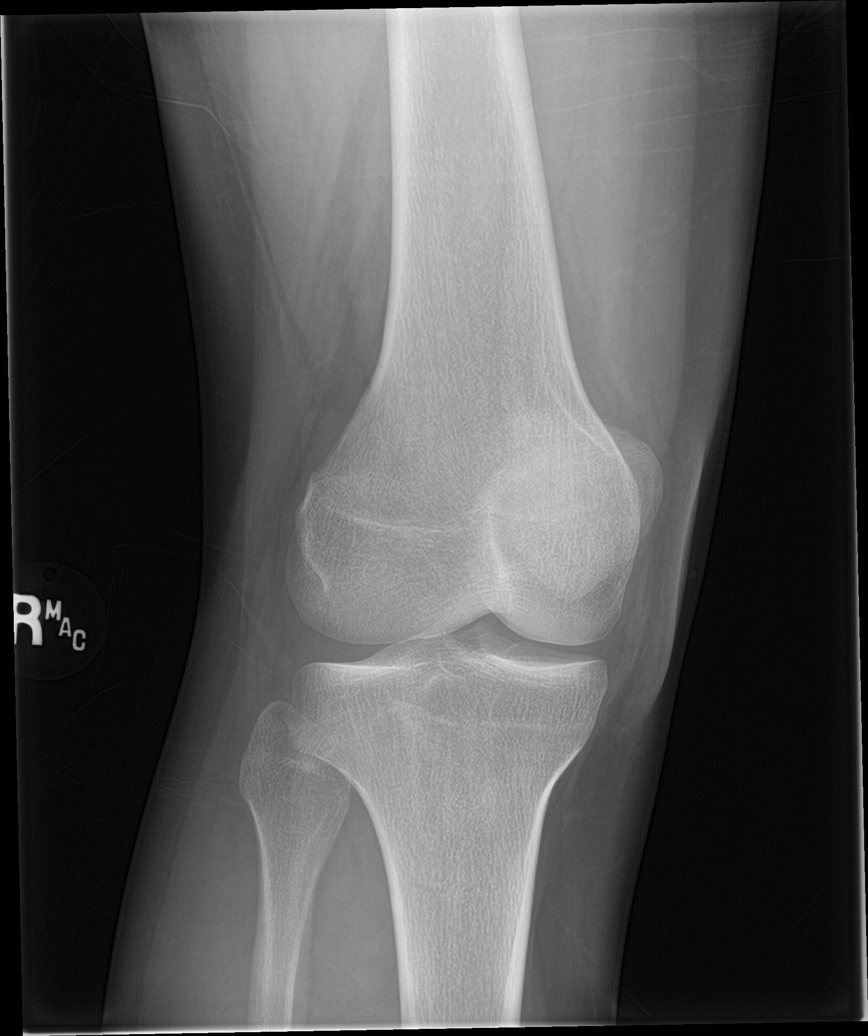

[5 of 5 positions shown; findings below may reference images not displayed]

FINDINGS: No evidence of fracture, dislocation, or joint effusion. No evidence
of arthropathy or other focal bone abnormality. Soft tissues are
unremarkable.
IMPRESSION: Negative.
